# Patient Record
Sex: Male | Born: 1980 | Race: Asian | Hispanic: No | Marital: Married | Smoking: Never smoker
Health system: Southern US, Community
[De-identification: ages and names within clinical notes are randomized; demographics above are authoritative.]

## PROBLEM LIST (undated history)

## (undated) DIAGNOSIS — K219 Gastro-esophageal reflux disease without esophagitis: Secondary | ICD-10-CM

## (undated) DIAGNOSIS — IMO0002 Reserved for concepts with insufficient information to code with codable children: Secondary | ICD-10-CM

## (undated) HISTORY — PX: VASECTOMY: SHX75

## (undated) HISTORY — DX: Gastro-esophageal reflux disease without esophagitis: K21.9

## (undated) HISTORY — PX: COSMETIC SURGERY: SHX468

## (undated) HISTORY — DX: Reserved for concepts with insufficient information to code with codable children: IMO0002

## (undated) HISTORY — PX: APPENDECTOMY: SHX54

---

## 2019-12-06 ENCOUNTER — Encounter (HOSPITAL_BASED_OUTPATIENT_CLINIC_OR_DEPARTMENT_OTHER): Payer: Self-pay

## 2019-12-06 ENCOUNTER — Other Ambulatory Visit: Payer: Self-pay

## 2019-12-06 ENCOUNTER — Emergency Department (HOSPITAL_BASED_OUTPATIENT_CLINIC_OR_DEPARTMENT_OTHER)
Admission: EM | Admit: 2019-12-06 | Discharge: 2019-12-06 | Disposition: A | Discharge status: Home / Self Care | Payer: 59 | Attending: Emergency Medicine | Admitting: Emergency Medicine

## 2019-12-06 ENCOUNTER — Emergency Department (HOSPITAL_BASED_OUTPATIENT_CLINIC_OR_DEPARTMENT_OTHER): Payer: 59

## 2019-12-06 DIAGNOSIS — S20211A Contusion of right front wall of thorax, initial encounter: Secondary | ICD-10-CM | POA: Diagnosis not present

## 2019-12-06 DIAGNOSIS — S29012A Strain of muscle and tendon of back wall of thorax, initial encounter: Secondary | ICD-10-CM | POA: Insufficient documentation

## 2019-12-06 DIAGNOSIS — S3992XA Unspecified injury of lower back, initial encounter: Secondary | ICD-10-CM | POA: Diagnosis present

## 2019-12-06 DIAGNOSIS — Y92410 Unspecified street and highway as the place of occurrence of the external cause: Secondary | ICD-10-CM | POA: Insufficient documentation

## 2019-12-06 MED ORDER — KETOROLAC TROMETHAMINE 30 MG/ML IJ SOLN
30.0000 mg | Freq: Once | INTRAMUSCULAR | Status: AC
  Administered 2019-12-06: 30 mg via INTRAMUSCULAR
  Filled 2019-12-06: qty 1

## 2019-12-06 MED ORDER — IBUPROFEN 600 MG PO TABS: 600.0000 mg | ORAL_TABLET | Freq: Four times a day (QID) | ORAL | 0 refills | Status: DC | PRN
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
## 2020-11-26 ENCOUNTER — Other Ambulatory Visit: Payer: Self-pay

## 2020-11-26 ENCOUNTER — Encounter: Payer: Self-pay | Admitting: Physician Assistant

## 2020-11-26 ENCOUNTER — Ambulatory Visit (INDEPENDENT_AMBULATORY_CARE_PROVIDER_SITE_OTHER): Payer: Managed Care, Other (non HMO) | Admitting: Physician Assistant

## 2020-11-26 VITALS — BP 137/94 | HR 85 | Temp 98.2°F | Ht 64.0 in | Wt 179.0 lb

## 2020-11-26 DIAGNOSIS — Z6830 Body mass index (BMI) 30.0-30.9, adult: Secondary | ICD-10-CM

## 2020-11-26 DIAGNOSIS — R030 Elevated blood-pressure reading, without diagnosis of hypertension: Secondary | ICD-10-CM | POA: Diagnosis not present

## 2020-11-26 DIAGNOSIS — E6609 Other obesity due to excess calories: Secondary | ICD-10-CM | POA: Diagnosis not present

## 2020-11-26 DIAGNOSIS — K209 Esophagitis, unspecified without bleeding: Secondary | ICD-10-CM | POA: Diagnosis not present

## 2020-11-26 DIAGNOSIS — Z8669 Personal history of other diseases of the nervous system and sense organs: Secondary | ICD-10-CM

## 2020-11-26 NOTE — Patient Instructions (Addendum)
Recheck in 4 weeks - come fasting, try to get a morning appointment  Work on increasing exercise - 3 days cardio, 2 days weight Water intake 64 - 80 ounces daily Low salt in diet

## 2020-11-26 NOTE — Progress Notes (Signed)
   Subjective:    Patient ID: Dylan Vazquez, male    DOB: [birth year], 40 y.o.   MRN: [id number]  Chief Complaint  Patient presents with   Establish Care    HPI 40 y.o. patient presents today for new patient establishment with me.  Patient was previously established with physician in [place]. Here with his wife for visit today.   Current Care Team: None    Acute Concerns: Would like labs rechecked   Weight gain -No exercise -Vegetarian diet  -Told he was prediabetic previously. Father with hx of diabetes.   Chronic Concerns: See PMH listed below, as well as A/P for details on issues we specifically discussed during today's visit.    Upper GI Endoscopy 09-27-2019: Gr A esophagitis   Migraines - previously saw neuro specialist in [place] and says he has not had any issues since then. He stopped sodas as well, which helped.    History reviewed. No pertinent past medical history.  Past Surgical History:  Procedure Laterality Date   APPENDECTOMY     VASECTOMY      Family History  Problem Relation Age of Onset   Hypertension Mother    Diabetes Father    Arthritis Father    Asthma Son     Social History   Tobacco Use   Smoking status: Never  Substance Use Topics   Alcohol use: Never   Drug use: Never     Allergies  Allergen Reactions   Other Other (See Comments)    Review of Systems NEGATIVE UNLESS OTHERWISE INDICATED IN HPI      Objective:     BP (!) 137/94   Pulse 85   Temp 98.2 F (36.8 C) (Temporal)   Ht 5\' 4"  (1.626 m)   Wt 179 lb (81.2 kg)   SpO2 100%   BMI 30.73 kg/m   Wt Readings from Last 3 Encounters:  11/26/20 179 lb (81.2 kg)  12/06/19 176 lb 5.9 oz (80 kg)    BP Readings from Last 3 Encounters:  11/26/20 (!) 137/94  12/06/19 (!) 155/92     Physical Exam     Assessment & Plan:   Problem List Items Addressed This Visit       Digestive   Esophagitis determined by endoscopy     Other   Hx of migraines   Other Visit  Diagnoses     Elevated blood pressure reading    -  Primary   Class 1 obesity due to excess calories without serious comorbidity with body mass index (BMI) of 30.0 to 30.9 in adult           1. Elevated blood pressure reading 2. Class 1 obesity due to excess calories without serious comorbidity with body mass index (BMI) of 30.0 to 30.9 in adult -Elevated reading today -Encouraged him to work on increasing exercise - 3 days cardio, 2 days weight; Water intake 64 - 80 ounces daily; Low salt in diet  -Recheck in 4 weeks -If still trending high, consider starting on amlodipine or losartan  3. Hx of migraines -Resolved, no current issues.  4. Esophagitis determined by endoscopy -Stable, takes Prilosec OTC 20 mg     Opie Fanton M Cainen Burnham, PA-C

## 2020-11-27 DIAGNOSIS — Z8669 Personal history of other diseases of the nervous system and sense organs: Secondary | ICD-10-CM | POA: Insufficient documentation

## 2020-11-27 DIAGNOSIS — K209 Esophagitis, unspecified without bleeding: Secondary | ICD-10-CM | POA: Insufficient documentation

## 2020-12-27 ENCOUNTER — Ambulatory Visit (INDEPENDENT_AMBULATORY_CARE_PROVIDER_SITE_OTHER): Payer: Managed Care, Other (non HMO) | Admitting: Physician Assistant

## 2020-12-27 ENCOUNTER — Other Ambulatory Visit: Payer: Self-pay

## 2020-12-27 VITALS — HR 102 | Temp 99.6°F | Ht 64.0 in

## 2020-12-27 DIAGNOSIS — U071 COVID-19: Secondary | ICD-10-CM

## 2020-12-27 LAB — POCT INFLUENZA A/B
Influenza A, POC: NEGATIVE
Influenza B, POC: NEGATIVE

## 2020-12-27 LAB — POC COVID19 BINAXNOW: SARS Coronavirus 2 Ag: POSITIVE — AB

## 2020-12-27 MED ORDER — MOLNUPIRAVIR EUA 200MG CAPSULE: 4.0000 | ORAL_CAPSULE | Freq: Two times a day (BID) | ORAL | 0 refills | Status: AC

## 2020-12-27 NOTE — Progress Notes (Signed)
° °  Subjective:    Patient ID: Dylan Vazquez, male    DOB: [birth year], 40 y.o.   MRN: [id number]  Chief Complaint  Patient presents with   URI    Fever,cough,chills    HPI Patient is in today recheck from last visit, but then came in for URI symptoms. Patient was only seen and evaluated by the nurse today.   No past medical history on file.  Past Surgical History:  Procedure Laterality Date   APPENDECTOMY     VASECTOMY      Family History  Problem Relation Age of Onset   Hypertension Mother    Diabetes Father    Arthritis Father    Asthma Son     Social History   Tobacco Use   Smoking status: Never  Substance Use Topics   Alcohol use: Never   Drug use: Never     Allergies  Allergen Reactions   Other Other (See Comments)    Review of Systems NEGATIVE UNLESS OTHERWISE INDICATED IN HPI      Objective:     Pulse (!) 102    Temp 99.6 F (37.6 C)    Ht 5\' 4"  (1.626 m)    SpO2 99%    BMI 30.73 kg/m   Wt Readings from Last 3 Encounters:  11/26/20 179 lb (81.2 kg)  12/06/19 176 lb 5.9 oz (80 kg)    BP Readings from Last 3 Encounters:  11/26/20 (!) 137/94  12/06/19 (!) 155/92     Physical Exam Not examined    Assessment & Plan:   Problem List Items Addressed This Visit   None Visit Diagnoses     COVID-19    -  Primary   Relevant Medications   molnupiravir EUA (LAGEVRIO) 200 mg CAPS capsule   Other Relevant Orders   POCT Influenza A/B (Completed)   POC COVID-19 (Completed)        Meds ordered this encounter  Medications   molnupiravir EUA (LAGEVRIO) 200 mg CAPS capsule    Sig: Take 4 capsules (800 mg total) by mouth 2 (two) times daily for 5 days.    Dispense:  40 capsule    Refill:  0    1. COVID-19 Positive COVID-19 test, seen and evaluated by CMA only. CMA informed him of positive test and that medication would be sent to his pharmacy. Pt was informed to reschedule his appointment.    Naida Escalante M Tasneem Cormier, PA-C

## 2020-12-28 ENCOUNTER — Telehealth: Payer: Self-pay

## 2020-12-28 NOTE — Telephone Encounter (Signed)
Patient states was seen yesterday, 12/19.  States is requesting a script for a cough med to be sent to [facility] [address]

## 2020-12-29 ENCOUNTER — Other Ambulatory Visit: Payer: Self-pay | Admitting: Physician Assistant

## 2020-12-29 MED ORDER — PROMETHAZINE-DM 6.25-15 MG/5ML PO SYRP: 5.0000 mL | ORAL_SOLUTION | Freq: Every evening | ORAL | 0 refills | Status: DC | PRN

## 2020-12-29 MED ORDER — BENZONATATE 100 MG PO CAPS: 100.0000 mg | ORAL_CAPSULE | Freq: Three times a day (TID) | ORAL | 0 refills | Status: AC | PRN

## 2020-12-29 NOTE — Telephone Encounter (Signed)
I called and spoke directly to patient this morning. He is feeling better, but still having cough. I sent promethazine syrup for at bedtime and tessalon perles to take during the daytime. He is taking the molnupiravir without any issues. Discussed COVID-19 protocol with him and advised f/up as needed. He will reschedule for annual CPE in the new year.

## 2021-01-14 ENCOUNTER — Ambulatory Visit: Payer: Managed Care, Other (non HMO) | Admitting: Family Medicine

## 2021-01-14 ENCOUNTER — Other Ambulatory Visit: Payer: Self-pay

## 2021-01-14 ENCOUNTER — Encounter: Payer: Self-pay | Admitting: Family Medicine

## 2021-01-14 VITALS — BP 130/80 | HR 85 | Temp 98.2°F | Ht 64.0 in | Wt 175.0 lb

## 2021-01-14 DIAGNOSIS — R051 Acute cough: Secondary | ICD-10-CM

## 2021-01-14 MED ORDER — PREDNISONE 20 MG PO TABS: 40.0000 mg | ORAL_TABLET | Freq: Every day | ORAL | 0 refills | Status: AC

## 2021-01-14 NOTE — Patient Instructions (Signed)
Meds have been sent the the pharmacy °You can take tylenol for pain/fevers °If worsening symptoms, let us know or go to the Emergency room  ° ° °

## 2021-01-14 NOTE — Progress Notes (Signed)
° °  Subjective:     Patient ID: Dylan Vazquez, male    DOB: [birth year], 41 y.o.   MRN: [id number]  Chief Complaint  Patient presents with   Cough    Lingering cough from positive covid on 12/27/2020 Productive cough     HPI Started about 12/16-cough/congestion.  + covid-took molnupiravir and felt better. Went to [facility].    Tele doc rx 12/30 pred 2.5mg  for 3 day. Still w/cough productive of yellow.  Still congested as well.   In past, covid twice and got higher doses of steroids and worked.  No h/o asthma. No sob.  No f/c.  Health Maintenance Due  Topic Date Due   COVID-19 Vaccine (1) Never done   HIV Screening  Never done   Hepatitis C Screening  Never done   TETANUS/TDAP  Never done   INFLUENZA VACCINE  Never done    History reviewed. No pertinent past medical history.  Past Surgical History:  Procedure Laterality Date   APPENDECTOMY     VASECTOMY      Outpatient Medications Prior to Visit  Medication Sig Dispense Refill   Multiple Vitamin (ONE-A-DAY ESSENTIAL) TABS      omeprazole (PRILOSEC OTC) 20 MG tablet Take 20 mg by mouth daily.     promethazine-dextromethorphan (PROMETHAZINE-DM) 6.25-15 MG/5ML syrup Take 5 mLs by mouth at bedtime as needed for cough. 120 mL 0   Omega-3 Fatty Acids (FISH OIL ULTRA) 1400 MG CAPS      No facility-administered medications prior to visit.    Allergies  Allergen Reactions   Other Other (See Comments)   OMV:EHMCNOBS/[id number] except as noted in HPI      Objective:     BP 130/80    Pulse 85    Temp 98.2 F (36.8 C) (Temporal)    Ht 5\' 4"  (1.626 m)    Wt 175 lb (79.4 kg)    SpO2 98%    BMI 30.04 kg/m  Wt Readings from Last 3 Encounters:  01/14/21 175 lb (79.4 kg)  11/26/20 179 lb (81.2 kg)  12/06/19 176 lb 5.9 oz (80 kg)        Gen: WDWN NAD HEENT: NCAT, conjunctiva not injected, sclera nonicteric TM WNL B, OP moist, no exudates  NECK:  supple, no thyromegaly, no nodes, no carotid  bruits CARDIAC: RRR, S1S2+, no murmur. DP 2+B LUNGS: CTAB. No wheezes EXT:  no edema MSK: no gross abnormalities.  NEURO: A&O x3.  CN II-XII intact.  PSYCH: normal mood. Good eye contact  Assessment & Plan:   Problem List Items Addressed This Visit   None Visit Diagnoses     Acute cough    -  Primary      Cough-post covid.  Prob inflammation.  Will do pred x 5 days.  Discussed course.  Worse, no change, consider abx  Meds ordered this encounter  Medications   predniSONE (DELTASONE) 20 MG tablet    Sig: Take 2 tablets (40 mg total) by mouth daily with breakfast for 5 days.    Dispense:  10 tablet    Refill:  0    12/08/19, MD

## 2021-01-19 ENCOUNTER — Other Ambulatory Visit: Payer: Self-pay | Admitting: *Deleted

## 2021-01-19 ENCOUNTER — Telehealth: Payer: Self-pay

## 2021-01-19 MED ORDER — AMOXICILLIN 875 MG PO TABS: 875.0000 mg | ORAL_TABLET | Freq: Two times a day (BID) | ORAL | 0 refills | Status: AC

## 2021-01-19 NOTE — Telephone Encounter (Signed)
Patient states he was advised by provider to call back with an update from [place] 1/6.    Patient states his cough has gone away but he still has congestion.

## 2021-01-19 NOTE — Telephone Encounter (Signed)
Rx sent to the pharmacy. Patient notified and verbalized understanding. 
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
## 2021-10-03 ENCOUNTER — Encounter: Payer: Self-pay | Admitting: *Deleted

## 2021-12-08 ENCOUNTER — Ambulatory Visit (INDEPENDENT_AMBULATORY_CARE_PROVIDER_SITE_OTHER): Payer: Managed Care, Other (non HMO) | Admitting: Physician Assistant

## 2021-12-08 ENCOUNTER — Encounter: Payer: Self-pay | Admitting: Physician Assistant

## 2021-12-08 VITALS — BP 128/88 | HR 64 | Temp 98.0°F | Ht 64.17 in | Wt 171.0 lb

## 2021-12-08 DIAGNOSIS — Z131 Encounter for screening for diabetes mellitus: Secondary | ICD-10-CM

## 2021-12-08 DIAGNOSIS — K219 Gastro-esophageal reflux disease without esophagitis: Secondary | ICD-10-CM

## 2021-12-08 DIAGNOSIS — Z13220 Encounter for screening for lipoid disorders: Secondary | ICD-10-CM

## 2021-12-08 DIAGNOSIS — Z0000 Encounter for general adult medical examination without abnormal findings: Secondary | ICD-10-CM

## 2021-12-08 DIAGNOSIS — R519 Headache, unspecified: Secondary | ICD-10-CM | POA: Insufficient documentation

## 2021-12-08 HISTORY — DX: Encounter for general adult medical examination without abnormal findings: Z00.00

## 2021-12-08 LAB — COMPREHENSIVE METABOLIC PANEL
ALT: 22 U/L (ref 0–53)
AST: 16 U/L (ref 0–37)
Albumin: 4.6 g/dL (ref 3.5–5.2)
Alkaline Phosphatase: 100 U/L (ref 39–117)
BUN: 6 mg/dL (ref 6–23)
CO2: 29 mEq/L (ref 19–32)
Calcium: 9.8 mg/dL (ref 8.4–10.5)
Chloride: 103 mEq/L (ref 96–112)
Creatinine, Ser: 0.79 mg/dL (ref 0.40–1.50)
GFR: 111.88 mL/min (ref >60.00)
Glucose, Bld: 93 mg/dL (ref 70–99)
Potassium: 5.1 mEq/L (ref 3.5–5.1)
Sodium: 141 mEq/L (ref 135–145)
Total Bilirubin: 0.4 mg/dL (ref 0.2–1.2)
Total Protein: 7.6 g/dL (ref 6.0–8.3)

## 2021-12-08 LAB — CBC WITH DIFFERENTIAL/PLATELET
Basophils Absolute: 0 10*3/uL (ref 0.0–0.1)
Basophils Relative: 0.5 % (ref 0.0–3.0)
Eosinophils Absolute: 0 10*3/uL (ref 0.0–0.7)
Eosinophils Relative: 0.5 % (ref 0.0–5.0)
HCT: 47.5 % (ref 39.0–52.0)
Hemoglobin: 15.9 g/dL (ref 13.0–17.0)
Lymphocytes Relative: 23.6 % (ref 12.0–46.0)
Lymphs Abs: 1.7 10*3/uL (ref 0.7–4.0)
MCHC: 33.5 g/dL (ref 30.0–36.0)
MCV: 85.6 fl (ref 78.0–100.0)
Monocytes Absolute: 0.4 10*3/uL (ref 0.1–1.0)
Monocytes Relative: 6.3 % (ref 3.0–12.0)
Neutro Abs: 4.9 10*3/uL (ref 1.4–7.7)
Neutrophils Relative %: 69.1 % (ref 43.0–77.0)
Platelets: 359 10*3/uL (ref 150.0–400.0)
RBC: 5.55 Mil/uL (ref 4.22–5.81)
RDW: 13.7 % (ref 11.5–15.5)
WBC: 7.1 10*3/uL (ref 4.0–10.5)

## 2021-12-08 LAB — LIPID PANEL
Cholesterol: 209 mg/dL — ABNORMAL HIGH (ref 0–200)
HDL: 46.9 mg/dL (ref >39.00)
LDL Cholesterol: 125 mg/dL — ABNORMAL HIGH (ref 0–99)
NonHDL: 161.62
Total CHOL/HDL Ratio: 4
Triglycerides: 184 mg/dL — ABNORMAL HIGH (ref 0.0–149.0)
VLDL: 36.8 mg/dL (ref 0.0–40.0)

## 2021-12-08 LAB — HEMOGLOBIN A1C: Hgb A1c MFr Bld: 5.7 % (ref 4.6–6.5)

## 2021-12-08 LAB — TSH: TSH: 0.98 u[IU]/mL (ref 0.35–5.50)

## 2021-12-08 NOTE — Assessment & Plan Note (Signed)
Avoiding triggers is helping; lifestyle controlled at this time

## 2021-12-08 NOTE — Assessment & Plan Note (Signed)
Controlled with esomeprazole

## 2021-12-08 NOTE — Assessment & Plan Note (Signed)
>>  ASSESSMENT AND PLAN FOR INTERMITTENT HEADACHE WRITTEN ON 12/08/2021 11:07 AM BY ALLWARDT, ALYSSA M, PA-C  Avoiding triggers is helping; lifestyle controlled at this time

## 2021-12-08 NOTE — Patient Instructions (Addendum)
Great to see you! Keep up the excellent work!  Labs today, treat pending abnormal results.   Tdap and flu shot updated today.   Let me know if headaches change in frequency or intensity at all. Avoid triggers.

## 2021-12-08 NOTE — Assessment & Plan Note (Signed)
Age-appropriate screening and counseling performed today. Will check labs and call with results. Preventive measures discussed and printed in AVS for patient.   Patient Counseling: [x]  Nutrition: Stressed importance of moderation in sodium/caffeine intake, saturated fat and cholesterol, caloric balance, sufficient intake of fresh fruits, vegetables, and fiber.  [x]  Stressed the importance of regular exercise.   [x]  Substance Abuse: Discussed cessation/primary prevention of tobacco, alcohol, or other drug use; driving or other dangerous activities under the influence; availability of treatment for abuse.   []  Injury prevention: Discussed safety belts, safety helmets, smoke detector, smoking near bedding or upholstery.   []  Sexuality: Discussed sexually transmitted diseases, partner selection, use of condoms, avoidance of unintended pregnancy  and contraceptive alternatives.   [x]  Dental health: Discussed importance of regular tooth brushing, flossing, and dental visits.  [x]  Health maintenance and immunizations reviewed. Please refer to Health maintenance section.     

## 2021-12-08 NOTE — Progress Notes (Signed)
Subjective:    Patient ID: Dylan Vazquez, male    DOB: [birth year], 41 y.o.   MRN: [id number]  Chief Complaint  Patient presents with   Annual Exam    Pt in office for annual CPE with fasting labs; pt getting headaches during stress; and also c/o acid reflux    HPI Patient is in today for annual exam. Here with his wife.  Acute concerns: Occasional right sided headaches - triggered from bathroom cleaners, sodas  Health maintenance: Lifestyle/ exercise: Walking more Nutrition: Working on nutrition changes including lighter meals at night, not snacking, less salt  Mental health: Doing well  Caffeine: Tea once daily  Sleep: Doing well, about 7 hours per night  Substance use: None  Sexual activity: Monogamous  Immunizations: Will update Tdap and flu today  Colonoscopy: start at age 40    Past Medical History:  Diagnosis Date   Encounter for annual physical exam 12/08/2021   GERD (gastroesophageal reflux disease) 01/10/2007   Ulcer 01/10/2007   Acidity issue on esophageal    Past Surgical History:  Procedure Laterality Date   APPENDECTOMY     COSMETIC SURGERY     Gynacomastia   VASECTOMY      Family History  Problem Relation Age of Onset   Hypertension Mother    Diabetes Father    Arthritis Father    Asthma Son     Social History   Tobacco Use   Smoking status: Never  Substance Use Topics   Alcohol use: Never   Drug use: Never     Allergies  Allergen Reactions   Other Other (See Comments)    Review of Systems NEGATIVE UNLESS OTHERWISE INDICATED IN HPI      Objective:     BP 128/88 (BP Location: Left Arm)   Pulse 64   Temp 98 F (36.7 C) (Temporal)   Ht 5' 4.17" (1.63 m)   Wt 171 lb (77.6 kg)   SpO2 98%   BMI 29.19 kg/m   Wt Readings from Last 3 Encounters:  12/08/21 171 lb (77.6 kg)  01/14/21 175 lb (79.4 kg)  11/26/20 179 lb (81.2 kg)    BP Readings from Last 3 Encounters:  12/08/21 128/88  01/14/21 130/80  11/26/20 (!) 137/94      Physical Exam Vitals and nursing note reviewed.  Constitutional:      General: He is not in acute distress.    Appearance: Normal appearance. He is not toxic-appearing.  HENT:     Head: Normocephalic and atraumatic.     Right Ear: Tympanic membrane, ear canal and external ear normal.     Left Ear: Tympanic membrane, ear canal and external ear normal.     Nose: Nose normal.     Mouth/Throat:     Mouth: Mucous membranes are moist.     Pharynx: Oropharynx is clear.  Eyes:     Extraocular Movements: Extraocular movements intact.     Conjunctiva/sclera: Conjunctivae normal.     Pupils: Pupils are equal, round, and reactive to light.  Cardiovascular:     Rate and Rhythm: Normal rate and regular rhythm.     Pulses: Normal pulses.     Heart sounds: Normal heart sounds.  Pulmonary:     Effort: Pulmonary effort is normal.     Breath sounds: Normal breath sounds.  Abdominal:     General: Abdomen is flat. Bowel sounds are normal.     Palpations: Abdomen is soft.     Tenderness: There  is no abdominal tenderness.  Musculoskeletal:        General: Normal range of motion.     Cervical back: Normal range of motion and neck supple.  Skin:    General: Skin is warm and dry.  Neurological:     General: No focal deficit present.     Mental Status: He is alert and oriented to person, place, and time.     Cranial Nerves: No cranial nerve deficit.     Motor: No weakness.     Gait: Gait normal.  Psychiatric:        Mood and Affect: Mood normal.        Behavior: Behavior normal.        Assessment & Plan:  Encounter for annual physical exam Assessment & Plan: Age-appropriate screening and counseling performed today. Will check labs and call with results. Preventive measures discussed and printed in AVS for patient.   Patient Counseling: [x]   Nutrition: Stressed importance of moderation in sodium/caffeine intake, saturated fat and cholesterol, caloric balance, sufficient intake of fresh  fruits, vegetables, and fiber.  [x]   Stressed the importance of regular exercise.   [x]   Substance Abuse: Discussed cessation/primary prevention of tobacco, alcohol, or other drug use; driving or other dangerous activities under the influence; availability of treatment for abuse.   []   Injury prevention: Discussed safety belts, safety helmets, smoke detector, smoking near bedding or upholstery.   []   Sexuality: Discussed sexually transmitted diseases, partner selection, use of condoms, avoidance of unintended pregnancy  and contraceptive alternatives.   [x]   Dental health: Discussed importance of regular tooth brushing, flossing, and dental visits.  [x]   Health maintenance and immunizations reviewed. Please refer to Health maintenance section.      Orders: -     CBC with Differential/Platelet -     Comprehensive metabolic panel -     Lipid panel -     TSH -     Hemoglobin A1c  Gastroesophageal reflux disease without esophagitis Assessment & Plan: Controlled with esomeprazole    Diabetes mellitus screening -     Comprehensive metabolic panel -     Hemoglobin A1c  Screening for cholesterol level -     Lipid panel  Intermittent headache Assessment & Plan: Avoiding triggers is helping; lifestyle controlled at this time         Return in about 1 year (around 12/09/2022) for Annual exam .  This note was prepared with assistance of Dragon voice recognition software. Occasional wrong-word or sound-a-like substitutions may have occurred due to the inherent limitations of voice recognition software.    Shay Bartoli M Winthrop Shannahan, PA-C

## 2021-12-22 ENCOUNTER — Encounter: Payer: Self-pay | Admitting: *Deleted

## 2022-03-14 ENCOUNTER — Encounter: Payer: Self-pay | Admitting: Internal Medicine

## 2022-03-14 ENCOUNTER — Ambulatory Visit: Payer: Managed Care, Other (non HMO) | Admitting: Internal Medicine

## 2022-03-14 ENCOUNTER — Ambulatory Visit: Payer: Managed Care, Other (non HMO) | Admitting: Physician Assistant

## 2022-03-14 VITALS — BP 120/86 | HR 76 | Temp 98.2°F | Ht 64.17 in | Wt 170.8 lb

## 2022-03-14 DIAGNOSIS — J329 Chronic sinusitis, unspecified: Secondary | ICD-10-CM | POA: Diagnosis not present

## 2022-03-14 DIAGNOSIS — G43809 Other migraine, not intractable, without status migrainosus: Secondary | ICD-10-CM | POA: Diagnosis not present

## 2022-03-14 MED ORDER — BENZONATATE 100 MG PO CAPS: ORAL_CAPSULE | ORAL | 2 refills | Status: DC

## 2022-03-14 MED ORDER — SIMPLY SALINE 0.9 % NA AERS: 2.0000 | INHALATION_SPRAY | NASAL | 11 refills | Status: DC

## 2022-03-14 MED ORDER — FLUTICASONE PROPIONATE 50 MCG/ACT NA SUSP: 2.0000 | Freq: Every day | NASAL | 6 refills | Status: AC

## 2022-03-14 MED ORDER — SUMATRIPTAN SUCCINATE 50 MG PO TABS: 50.0000 mg | ORAL_TABLET | Freq: Every day | ORAL | 0 refills | Status: AC | PRN

## 2022-03-14 MED ORDER — LORATADINE 10 MG PO TABS: 10.0000 mg | ORAL_TABLET | Freq: Every day | ORAL | 11 refills | Status: DC

## 2022-03-14 MED ORDER — AMOXICILLIN-POT CLAVULANATE 875-125 MG PO TABS: 1.0000 | ORAL_TABLET | Freq: Two times a day (BID) | ORAL | 0 refills | Status: DC

## 2022-03-14 NOTE — Patient Instructions (Signed)
Keeping Your Sinuses Healthy: A Guide to Saline Mists and Nasal Steroids  Introduction  Sinus congestion is a common problem that can make breathing difficult and lead to headaches, facial pressure, and even fatigue. While allergies and infections are common culprits, there are steps you can take to prevent congestion and keep your sinuses healthy. This handout will explain the benefits of using saline nasal misting rinses nightly and nasal steroid sprays for allergies, both as preventative and treatment measures.  Saline Nasal Mists: Your Daily Defense  Imagine a gentle shower cleaning out dust and allergens from your nasal passages.  Saline nasal mists are essentially a saltwater solution delivered in a fine mist. This mist helps in several ways:  Thin mucus: Saline helps loosen thick mucus, making it easier to clear and preventing it from becoming a breeding ground for bacteria. Moisten dry passages: Dry air can irritate nasal passages and make congestion worse. Saline mist adds moisture, soothing irritation and promoting healthy mucus production. Remove allergens and irritants: Dust, pollen, and other allergens can trigger congestion and allergies. Saline mist helps wash away these irritants, reducing inflammation and discomfort. Nightly Rinsing for Optimal Prevention  Regular use of saline nasal mists, particularly at night, can significantly benefit your sinus health. Here's why:  Nighttime drainage: While you sleep, mucus tends to drain more freely. Saline mist used before bed helps loosen mucus, allowing for better drainage and preventing it from accumulating and causing congestion. Preventative approach: Using saline mist nightly can help prevent congestion before it starts, reducing the likelihood of developing sinus infections and allergy flare-ups.  Nasal Steroid Sprays: Targeting Allergic Inflammation  If allergies are a major contributor to your sinus issues, nasal steroid  sprays can be a powerful tool. Here's how they work:  Reduce inflammation: Allergies cause inflammation in the nasal passages, leading to congestion and irritation. Steroid sprays work by reducing this inflammation, allowing you to breathe easier. Targeted relief: Unlike oral allergy medications that can cause drowsiness, nasal steroid sprays deliver medication directly to the affected area, minimizing side effects.  Combining Strategies for Maximum Relief  For optimal results, consider incorporating both saline mist and nasal steroid sprays into your routine:  Start with a saline mist: Use a saline nasal mist to loosen mucus and clear your nasal passages. Follow with a steroid spray (if needed): If you experience allergy symptoms, use a nasal steroid spray after the saline mist. This allows the steroid medication to reach deeper into the now-cleared nasal passages for better absorption.  Remember: Be consistent! Daily or nightly use, as recommended by your doctor, is key to reaping the full benefits of these treatments. Don't overdo it with too many steroid sprays. Just once daily max after nasal saline misting.  By incorporating saline nasal mists and nasal steroid sprays into your routine, you can take a proactive approach to sinus health, preventing congestion and keeping allergies at [place].  Ultimately, this results in less sinus, throat, and ear infections.  You can continue this even when sick as a treatment as well, reducing your dependence on other decongestants.

## 2022-03-14 NOTE — Progress Notes (Signed)
[facility]: [id number]   Routine Medical Office Visit  Patient:  Dylan Vazquez      Age: 42 y.o.       Sex:  male  Date:   03/14/2022  PCP:    Fredirick Lathe, PA-C   Today's Healthcare Provider: Loralee Pacas, MD   Assessment and Plan:   Dylan Vazquez was seen today for ongoing cough and right sided headache.  Chronic sinusitis, unspecified location -     Benzonatate; Take twice daily as needed by mouth  Dispense: 30 capsule; Refill: 2 -     Fluticasone Propionate; Place 2 sprays into both nostrils daily.  Dispense: 16 g; Refill: 6 -     Simply Saline; Place 2 each into the nose as directed. Use nightly for sinus hygiene long-term.  Can also be used as many times daily as desired to assist with clearing congested sinuses.  Dispense: 127 mL; Refill: 11 -     Loratadine; Take 1 tablet (10 mg total) by mouth daily.  Dispense: 30 tablet; Refill: 11 -     Amoxicillin-Pot Clavulanate; Take 1 tablet by mouth 2 (two) times daily.  Dispense: 20 tablet; Refill: 0  Other migraine without status migrainosus, not intractable -     SUMAtriptan Succinate; Take 1 tablet (50 mg total) by mouth daily as needed for migraine. May repeat in 2 hours if headache persists or recurs.  Dispense: 10 tablet; Refill: 0       Clinical Presentation:   The patient is a 42 y.o. male: Active Ambulatory Problems    Diagnosis Date Noted   Hx of migraines 11/27/2020   Esophagitis determined by endoscopy 11/27/2020   Encounter for annual physical exam 12/08/2021   Gastroesophageal reflux disease without esophagitis 12/08/2021   Intermittent headache 12/08/2021   Resolved Ambulatory Problems    Diagnosis Date Noted   No Resolved Ambulatory Problems   Past Medical History:  Diagnosis Date   GERD (gastroesophageal reflux disease) 01/10/2007   Ulcer 01/10/2007    Outpatient Medications Prior to Visit  Medication Sig   UNABLE TO FIND Esomeprazole '40mg'$  + Levosulpiride '75mg'$  (from  [place]).   [DISCONTINUED] benzonatate (TESSALON) 100 MG capsule    No facility-administered medications prior to visit.     Chief Complaint  Patient presents with   Ongoing cough    Intermittent for almost ten days, producing slight yellowish mucus. No other symptoms. Takes tessalon with relief, cough returns if not taken.   Right sided headache    Intermittent for a few years.    HPI  Doesn't feel like he has sinus congestion, patient reports productive cough. In room, lungs are clear to auscultation bilaterally, turbinates are swollen, boggy, erythematous, and cough is nonproductive. 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   Wife has systemic signs and symptoms such as fever, myalgias, malaise, fatigue, chills with new infection.       Clinical Data Analysis:  Physical Exam  BP 120/86 (BP Location: Left Arm, Patient Position: Sitting)   Pulse 76   Temp 98.2 F (36.8 C) (Temporal)   Ht 5' 4.17" (1.63 m)   Wt 170 lb 12.8 oz (77.5 kg)   SpO2 99%   BMI 29.16 kg/m  Wt Readings from Last 10 Encounters:  03/14/22 170 lb 12.8 oz (77.5 kg)  12/08/21 171 lb (77.6 kg)  01/14/21 175 lb (79.4 kg)  11/26/20 179 lb (  81.2 kg)  12/06/19 176 lb 5.9 oz (80 kg)   Vital signs reviewed.  Nursing notes reviewed. Weight trend reviewed. Abnormalities noted: none  Overweight  by BMI criteria is noted but in my medical opinion, BMI is an unreliable indicator of healthy body composition due to its inability to reflect lean muscle mass.  General Appearance:  Well developed, well nourished male in no acute distress.   Pulmonary:  Normal work of breathing at rest, no respiratory distress apparent. SpO2: 99 %  Musculoskeletal: All extremities are intact.  Neurological:  Awake, alert. No obvious focal neurological deficits or cognitive impairments.  Sensorium seems unclouded. Psychiatric:   Appropriate mood, pleasant demeanor Problem-specific findings:  lungs are clear to auscultation bilaterally, turbinates are swollen, boggy, erythematous, and cough is nonproductive.  results He Declined offered COVID testing on basis of long term symptom(s) with no systemic signs and symptoms such as fever, myalgias, malaise, fatigue, chills       Signed: Loralee Pacas, MD 03/14/2022 6:22 PM

## 2022-03-27 ENCOUNTER — Encounter: Payer: Self-pay | Admitting: Family

## 2022-03-27 ENCOUNTER — Ambulatory Visit: Payer: Managed Care, Other (non HMO) | Admitting: Family

## 2022-03-27 VITALS — BP 131/88 | HR 73 | Temp 97.7°F | Ht 64.0 in | Wt 172.0 lb

## 2022-03-27 DIAGNOSIS — R053 Chronic cough: Secondary | ICD-10-CM | POA: Diagnosis not present

## 2022-03-27 MED ORDER — GUAIFENESIN ER 600 MG PO TB12: 1200.0000 mg | ORAL_TABLET | Freq: Two times a day (BID) | ORAL | 0 refills | Status: DC

## 2022-03-27 MED ORDER — PREDNISONE 20 MG PO TABS: ORAL_TABLET | ORAL | 0 refills | Status: DC

## 2022-03-27 NOTE — Progress Notes (Signed)
Patient ID: Dylan Vazquez, male    DOB: [birth year], 42 y.o.   MRN: [id number]  Chief Complaint  Patient presents with   Cough    Pt c/o cough with yellow mucus for about a month. Just finished Amox and taking Tessalon pearles are helping sx but not much.     HPI:      Persistent cough:  finished Augmentin on 3/15 after seeing another provider in office, states he was having cough also then & headache,  feels like he has phlegm in his chest, that is not completely coming up, it is light yellowish in color. Denies any fatigue, fever, sinus sx, SOB, or body aches.  States he coughs after eating or drinking in the morning, but not in evenings or when lying down, and denies any heartburn lately, he has PPI levosulpiride/esomeprazole med he takes from [place], normally just takes prn. 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   Assessment & Plan:  1. Persistent cough for 3 weeks or longer - lungs clear, pt clearing his throat many times during visit. sending pred pack, generic Mucinex, advised on use & SE of both, advised to continue the Claritin qd & Flonase bid, using saline nasal spray tid & prior to Flonase. Advised his allergy sx may be worse this year & should wear a mask when walking outside & keep windows closed until pollen is gone. Also advised pt take his antacid pill daily for next 2 weeks to see if this helps sx also.  - predniSONE (DELTASONE) 20 MG tablet; Take 2 pills in the morning with breakfast for 3 days, then 1 pill for 2 days  Dispense: 8 tablet; Refill: 0 - guaiFENesin (MUCINEX) 600 MG 12 hr tablet; Take 2 tablets (1,200 mg total) by mouth 2 (two) times daily.  Dispense: 20 tablet; Refill: 0  Subjective:    Outpatient Medications Prior to Visit  Medication Sig Dispense Refill   benzonatate (TESSALON) 100 MG capsule Take twice daily as needed by mouth 30 capsule 2   fluticasone (FLONASE) 50 MCG/ACT nasal spray Place 2 sprays into both nostrils daily. 16 g  6   loratadine (CLARITIN) 10 MG tablet Take 1 tablet (10 mg total) by mouth daily. 30 tablet 11   Saline (SIMPLY SALINE) 0.9 % AERS Place 2 each into the nose as directed. Use nightly for sinus hygiene long-term.  Can also be used as many times daily as desired to assist with clearing congested sinuses. 127 mL 11   SUMAtriptan (IMITREX) 50 MG tablet Take 1 tablet (50 mg total) by mouth daily as needed for migraine. May repeat in 2 hours if headache persists or recurs. 10 tablet 0   UNABLE TO FIND Esomeprazole 40mg  + Levosulpiride 75mg  (from [place]).     amoxicillin-clavulanate (AUGMENTIN) 875-125 MG tablet Take 1 tablet by mouth 2 (two) times daily. (Patient not taking: Reported on 03/27/2022) 20 tablet 0   No facility-administered medications prior to visit.   Past Medical History:  Diagnosis Date   Encounter for annual physical exam 12/08/2021   GERD (gastroesophageal reflux disease) 01/10/2007   Ulcer 01/10/2007   Acidity issue on esophageal   Past Surgical History:  Procedure Laterality Date   APPENDECTOMY     COSMETIC SURGERY     Gynacomastia   VASECTOMY     Allergies  Allergen Reactions   Other Other (See Comments)      Objective:    Physical Exam Vitals and nursing  note reviewed.  Constitutional:      General: He is not in acute distress.    Appearance: Normal appearance. He is obese. He is not ill-appearing.  HENT:     Head: Normocephalic.     Right Ear: Tympanic membrane and ear canal normal.     Left Ear: Tympanic membrane and ear canal normal.     Nose:     Right Sinus: No maxillary sinus tenderness or frontal sinus tenderness.     Left Sinus: No maxillary sinus tenderness or frontal sinus tenderness.     Mouth/Throat:     Mouth: Mucous membranes are moist.     Pharynx: Oropharyngeal exudate (mild) and posterior oropharyngeal erythema (mild) present. No pharyngeal swelling or uvula swelling.     Tonsils: No tonsillar exudate or tonsillar abscesses.   Cardiovascular:     Rate and Rhythm: Normal rate and regular rhythm.  Pulmonary:     Effort: Pulmonary effort is normal.     Breath sounds: Normal breath sounds.  Musculoskeletal:        General: Normal range of motion.     Cervical back: Normal range of motion.  Lymphadenopathy:     Head:     Right side of head: No preauricular or posterior auricular adenopathy.     Left side of head: No preauricular or posterior auricular adenopathy.     Cervical: No cervical adenopathy.  Skin:    General: Skin is warm and dry.  Neurological:     Mental Status: He is alert and oriented to person, place, and time.  Psychiatric:        Mood and Affect: Mood normal.    BP 131/88 (BP Location: Left Arm, Patient Position: Sitting, Cuff Size: Large)   Pulse 73   Temp 97.7 F (36.5 C) (Temporal)   Ht 5\' 4"  (1.626 m)   Wt 172 lb (78 kg)   SpO2 100%   BMI 29.52 kg/m  Wt Readings from Last 3 Encounters:  03/27/22 172 lb (78 kg)  03/14/22 170 lb 12.8 oz (77.5 kg)  12/08/21 171 lb (77.6 kg)       Jeanie Sewer, NP

## 2022-11-20 ENCOUNTER — Telehealth: Payer: Self-pay | Admitting: Physician Assistant

## 2022-11-20 NOTE — Telephone Encounter (Signed)
Patient requesting TOC from Alyssa to Dr.Morrison . States he would like to see MD . Please advise [phone]

## 2022-12-26 ENCOUNTER — Encounter: Payer: Self-pay | Admitting: Internal Medicine

## 2022-12-26 ENCOUNTER — Ambulatory Visit: Payer: 59 | Admitting: Internal Medicine

## 2022-12-26 VITALS — BP 129/87 | HR 98 | Temp 98.3°F | Ht 64.0 in | Wt 173.6 lb

## 2022-12-26 DIAGNOSIS — J309 Allergic rhinitis, unspecified: Secondary | ICD-10-CM | POA: Diagnosis not present

## 2022-12-26 DIAGNOSIS — B351 Tinea unguium: Secondary | ICD-10-CM

## 2022-12-26 DIAGNOSIS — R638 Other symptoms and signs concerning food and fluid intake: Secondary | ICD-10-CM | POA: Diagnosis not present

## 2022-12-26 DIAGNOSIS — E8881 Metabolic syndrome: Secondary | ICD-10-CM

## 2022-12-26 DIAGNOSIS — L918 Other hypertrophic disorders of the skin: Secondary | ICD-10-CM

## 2022-12-26 DIAGNOSIS — M25562 Pain in left knee: Secondary | ICD-10-CM

## 2022-12-26 DIAGNOSIS — E538 Deficiency of other specified B group vitamins: Secondary | ICD-10-CM

## 2022-12-26 DIAGNOSIS — Z119 Encounter for screening for infectious and parasitic diseases, unspecified: Secondary | ICD-10-CM

## 2022-12-26 DIAGNOSIS — Z0000 Encounter for general adult medical examination without abnormal findings: Secondary | ICD-10-CM

## 2022-12-26 DIAGNOSIS — G8929 Other chronic pain: Secondary | ICD-10-CM

## 2022-12-26 DIAGNOSIS — R000 Tachycardia, unspecified: Secondary | ICD-10-CM | POA: Diagnosis not present

## 2022-12-26 DIAGNOSIS — Z23 Encounter for immunization: Secondary | ICD-10-CM | POA: Diagnosis not present

## 2022-12-26 DIAGNOSIS — Z0001 Encounter for general adult medical examination with abnormal findings: Secondary | ICD-10-CM | POA: Diagnosis not present

## 2022-12-26 DIAGNOSIS — E161 Other hypoglycemia: Secondary | ICD-10-CM

## 2022-12-26 DIAGNOSIS — Z789 Other specified health status: Secondary | ICD-10-CM | POA: Diagnosis not present

## 2022-12-26 DIAGNOSIS — M25561 Pain in right knee: Secondary | ICD-10-CM | POA: Diagnosis not present

## 2022-12-26 DIAGNOSIS — K219 Gastro-esophageal reflux disease without esophagitis: Secondary | ICD-10-CM

## 2022-12-26 DIAGNOSIS — E559 Vitamin D deficiency, unspecified: Secondary | ICD-10-CM

## 2022-12-26 DIAGNOSIS — E785 Hyperlipidemia, unspecified: Secondary | ICD-10-CM | POA: Diagnosis not present

## 2022-12-26 DIAGNOSIS — Z113 Encounter for screening for infections with a predominantly sexual mode of transmission: Secondary | ICD-10-CM

## 2022-12-26 HISTORY — DX: Hyperlipidemia, unspecified: E78.5

## 2022-12-26 LAB — LIPID PANEL
Cholesterol: 222 mg/dL — ABNORMAL HIGH (ref 0–200)
HDL: 50.6 mg/dL (ref >39.00)
LDL Cholesterol: 135 mg/dL — ABNORMAL HIGH (ref 0–99)
NonHDL: 170.99
Total CHOL/HDL Ratio: 4
Triglycerides: 178 mg/dL — ABNORMAL HIGH (ref 0.0–149.0)
VLDL: 35.6 mg/dL (ref 0.0–40.0)

## 2022-12-26 LAB — COMPREHENSIVE METABOLIC PANEL
ALT: 31 U/L (ref 0–53)
AST: 21 U/L (ref 0–37)
Albumin: 4.4 g/dL (ref 3.5–5.2)
Alkaline Phosphatase: 88 U/L (ref 39–117)
BUN: 8 mg/dL (ref 6–23)
CO2: 30 meq/L (ref 19–32)
Calcium: 9.2 mg/dL (ref 8.4–10.5)
Chloride: 101 meq/L (ref 96–112)
Creatinine, Ser: 0.77 mg/dL (ref 0.40–1.50)
GFR: 111.92 mL/min (ref >60.00)
Glucose, Bld: 86 mg/dL (ref 70–99)
Potassium: 4.3 meq/L (ref 3.5–5.1)
Sodium: 137 meq/L (ref 135–145)
Total Bilirubin: 0.4 mg/dL (ref 0.2–1.2)
Total Protein: 7.6 g/dL (ref 6.0–8.3)

## 2022-12-26 LAB — CBC WITH DIFFERENTIAL/PLATELET
Basophils Absolute: 0 10*3/uL (ref 0.0–0.1)
Basophils Relative: 0.4 % (ref 0.0–3.0)
Eosinophils Absolute: 0.1 10*3/uL (ref 0.0–0.7)
Eosinophils Relative: 1.2 % (ref 0.0–5.0)
HCT: 48.5 % (ref 39.0–52.0)
Hemoglobin: 16.3 g/dL (ref 13.0–17.0)
Lymphocytes Relative: 25.6 % (ref 12.0–46.0)
Lymphs Abs: 1.5 10*3/uL (ref 0.7–4.0)
MCHC: 33.7 g/dL (ref 30.0–36.0)
MCV: 86.2 fL (ref 78.0–100.0)
Monocytes Absolute: 0.4 10*3/uL (ref 0.1–1.0)
Monocytes Relative: 6.8 % (ref 3.0–12.0)
Neutro Abs: 4 10*3/uL (ref 1.4–7.7)
Neutrophils Relative %: 66 % (ref 43.0–77.0)
Platelets: 367 10*3/uL (ref 150.0–400.0)
RBC: 5.63 Mil/uL (ref 4.22–5.81)
RDW: 13.5 % (ref 11.5–15.5)
WBC: 6 10*3/uL (ref 4.0–10.5)

## 2022-12-26 LAB — VITAMIN D 25 HYDROXY (VIT D DEFICIENCY, FRACTURES): VITD: 12.85 ng/mL — ABNORMAL LOW (ref 30.00–100.00)

## 2022-12-26 LAB — VITAMIN B12: Vitamin B-12: 206 pg/mL — ABNORMAL LOW (ref 211–911)

## 2022-12-26 LAB — HEMOGLOBIN A1C: Hgb A1c MFr Bld: 5.8 % (ref 4.6–6.5)

## 2022-12-26 MED ORDER — BLOOD GLUCOSE TEST VI STRP: 1.0000 | ORAL_STRIP | Freq: Three times a day (TID) | 0 refills | Status: AC

## 2022-12-26 MED ORDER — BLOOD GLUCOSE MONITORING SUPPL DEVI: 1.0000 | Freq: Three times a day (TID) | 0 refills | Status: AC

## 2022-12-26 MED ORDER — LANCETS MISC. MISC: 1.0000 | Freq: Three times a day (TID) | 0 refills | Status: DC

## 2022-12-26 MED ORDER — LANCET DEVICE MISC: 1.0000 | Freq: Three times a day (TID) | 0 refills | Status: AC

## 2022-12-26 MED ORDER — CICLOPIROX OLAMINE 0.77 % EX CREA: TOPICAL_CREAM | Freq: Two times a day (BID) | CUTANEOUS | 3 refills | Status: AC

## 2022-12-26 NOTE — Progress Notes (Signed)
[facility]: [phone]   -- Annual Preventive Medical Office Visit --  Patient:  Dylan Vazquez      Age: 42 y.o.       Sex:  male  Date:   12/26/2022 Patient Care Team: Lula Olszewski, MD as PCP - General (Internal Medicine) Today's Healthcare Provider: Lula Olszewski, MD  ========================================= Chief Complaint  Patient presents with   Transitions Of Care   Annual Exam    Purpose of Visit: Comprehensive preventive health assessment and personalized health maintenance planning.  This encounter was conducted as a Comprehensive Physical Exam (CPE) preventive care annual visit. The patient's medical history and problem list were reviewed to inform individualized preventive care recommendations.   No problem-specific medical treatment was provided during this visit.    Assessment & Plan Encounter for annual health examination  Tachycardia Tachycardia only with exertion. More than expected. Likely deconditioning They report an elevated heart rate of 160-170 bpm during moderate exercise, compared to peers with heart rates around 120 bpm, but have no symptoms of chest pain or shortness of breath. We discussed potential heart disease causes and decided on a coronary CT scan (calcium score) as a cost-effective assessment method, costing $100 out-of-pocket. We also considered an EKG and a Zio heart monitor, the latter providing 14 days of data, though it may not be feasible before year-end. We will order an EKG and a coronary CT scan. Chronic pain of both knees Chronic Knee Pain They have bilateral knee pain exacerbated by increased activity and stair climbing, likely related to weight and physical activity. We discussed that weight loss could reduce knee strain and potentially delay the need for future surgical intervention and recommend weight loss to reduce knee strain. Weight disorder Weight Disorder Their overweight status  contributes to knee pain and potentially other metabolic issues. We discussed the importance of weight loss for overall health improvement and recommend weight loss. Flu vaccine need  Gastroesophageal reflux disease without esophagitis Esophagitis They have ongoing heartburn despite long-term medication use and a previous endoscopy done in [place]. We discussed the need for a repeat endoscopy to assess the current condition and adjust treatment as necessary. We will refer them to gastroenterology for a repeat endoscopy. Metabolic syndrome Metabolic Syndrome They have borderline HbA1c, high cholesterol, and central obesity. We emphasized the importance of monitoring and managing these risk factors and explained that HbA1c and a lipid panel are essential for ongoing assessment. We will order HbA1c and a lipid panel. Hyperlipidemia, unspecified hyperlipidemia type  Vitamin D deficiency Vitamin D Deficiency Their vegetarian diet likely causes vitamin D deficiency. We discussed the importance of monitoring vitamin D levels, especially given dietary restrictions. We will order a vitamin D level. Vitamin B12 deficiency Vitamin B12 Deficiency Their vegetarian diet likely causes vitamin B12 deficiency. We discussed the importance of monitoring vitamin B12 levels, especially given dietary restrictions. We will order a vitamin B12 level. 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 We discussed using a blood glucose monitor to confirm the diagnosis, noting that insurance might not cover the cost. We will order a blood glucose monitor. Screen for STD (sexually transmitted disease)  Screening examination for infectious disease  Onychomycosis Onychomycosis They have a possible fungal infection of the toenails. We discussed antifungal cream as a  treatment option to improve nail appearance and health. We will prescribe  antifungal cream. Encounter for annual general medical examination with abnormal findings in adult  Preventative health care  Need for influenza vaccination  Need for Tdap vaccination  Allergic rhinitis, unspecified seasonality, unspecified trigger Allergic Rhinitis They have nasal congestion and swelling, particularly on the left side, likely due to allergies. We discussed daily sinus rinse and Flonase as effective treatments to reduce symptoms. We recommend a daily sinus rinse and will prescribe Flonase. Skin tags, multiple acquired Skin Tags They have multiple skin tags and are interested in removal. We discussed cryotherapy as a removal method, noting the potential for minor scarring. We will schedule cryotherapy for skin tag removal. General Health Maintenance During their annual wellness visit, we discussed the importance of regular screenings and vaccinations. We recommended an annual dental check-up and follow-up with an eye doctor. We will order a general health panel, recommend an annual dental check-up, and recommend follow-up with an eye doctor.  Follow-up We will schedule a follow-up appointment on January 08, 2023.  Today's Health Maintenance Counseling and Anticipatory Guidance:  Sinus health:  We encouraged sterile saline nasal misting sinus rinses daily for pollen, to reduce allergies and risk for sinus infections.   Sterile can based misting products are recommended due to superior misting and ease of maintaining sterility\ Sleep Apnea screening:  We encourage self monitoring of sleep quality with SnoreLab App and other tools/apps that are now available at retail [x]  Do you snore loudly [x]  Tired, fatigued, or sleepy during the daytime []  Witness apneas []  Significant hypertension []  BMI greater than 35    []  Age older than 40 years old [x]  Has large neck size over 15.7 in [x]  Male Total  Score: 4  encouraged patient to do sleep study but he will wait and do snore lab due to we can't do before deductible resets. He will try to get on next [place] visit. Cardiovascular Risk Factor Reduction:   Advised patient of need for regular exercise and diet rich and fruits and vegetables and healthy fats to reduce risk of heart attack and stroke.  Avoid first- and second-hand smoke and stimulants.   Avoid extreme exercise- exercise in moderation (150 minutes per week is a good goal) Discussed health benefits of physical activity, and encouraged him to engage in regular exercise appropriate for his age and condition. Exercise Activities and Dietary recommendations  Goals   None    Wt Readings from Last 3 Encounters:  12/26/22 173 lb 9.6 oz (78.7 kg)  03/27/22 172 lb (78 kg)  03/14/22 170 lb 12.8 oz (77.5 kg)  Body mass index is 29.8 kg/m. Health maintenance and immunizations reviewed and he was encouraged to complete anything that is due: Immunization History  Administered Date(s) Administered   Influenza, Seasonal, Injecte, Preservative Fre 12/26/2022   Tdap 12/26/2022   Unspecified SARS-COV-2 Vaccination 08/02/2019, 09/05/2019   Health Maintenance Due  Topic Date Due   HIV Screening  Never done   Hepatitis C Screening  Never done    Health Maintenance  Topic Date Due   HIV Screening  Never done   Hepatitis C Screening  Never done   COVID-19 Vaccine (3 - 2024-25 season) 01/11/2023 (Originally 09/10/2022)   DTaP/Tdap/Td (2 - Td or Tdap) 12/25/2032   INFLUENZA VACCINE  Completed   HPV VACCINES  Aged Out    Lifestyle / Social History Reviewed:  Social History   Tobacco Use   Smoking status: Never  Substance Use Topics   Alcohol use: Never   Drug use:  Never    My recommendation is total abstinence from all substances of abuse including smoke and 2nd hand smoke, alcohol, illicit drugs, smoking, inhalants, sugar, high risk sexual behavior Offered to assist with any use disorders or  addictions.   Sexual transmitted infection testing offered today, but patient declined as he feels he is low risk based on his sexual history.   Social History   Substance and Sexual Activity  Sexual Activity Yes   Birth control/protection: Surgical   Comment: vasectomy  .      12/26/2022    8:02 AM  Depression screen PHQ 2/9  Decreased Interest 0  Down, Depressed, Hopeless 0  PHQ - 2 Score 0   Injury prevention: Discussed not texting and driving.  Today's Cancer Screening  Penile/Testicle/Scrotum cancer screening: Asked the patient to self-monitor for genital tumors. Patient reports there are none. Thyroid cancer screening: patient advised to check by palpating thyroid for nodules Prostate cancer screening:  Denies family history of prostate cancer or hematospermia so too young for screening by current guidelines.(No results found for: "PSA") Colon cancer screening:    Denies strong family history of colon cancer or blood in stool so no screening is indicated until age 39.   Lung cancer screening:  Current guidelines recommend Individuals aged 81 to 84 who currently smoke or formerly smoked and have a >= 20 pack-year smoking history should undergo annual screening with low-dose computed tomography (LDCT). Skin cancer screening-  Advised regular sunscreen use. He denies worrisome, changing, or new skin lesions. Showed him pictures of melanomas for reference:   Return to care in 1 year for next preventative visit.      Subjective  42 y.o. male presents today for a complete physical exam.   HPI  AI-Extracted: Discussed the use of AI scribe software for clinical note transcription with the patient, who gave verbal consent to proceed.  History of Present Illness The patient, an overweight individual with a history of migraines and gastric issues, presents with concerns about an elevated heart rate during physical activity, knee pain, and persistent heartburn. The patient reports  experiencing headaches on one side during dancing and a heart rate of 160-170 bpm during brisk walking, which is significantly higher than their peers. The patient also mentions a recent increase in knee pain, which they attribute to frequent use of stairs in their new residence.  The patient has been managing their migraines with medication and reports that these episodes are infrequent. However, they continue to experience gastric discomfort, for which they have been taking medication for the past 15-20 years. The patient underwent an endoscopy 3-4 years ago in [place], but the gastric issues persist.  The patient also reports experiencing symptoms suggestive of reactive hypoglycemia for the past 10-12 years. They describe feeling shaky and sweaty if they delay their meals, particularly lunch, by even 30 minutes. This issue has not been previously diagnosed or managed.  The patient has a history of vitamin D and B12 deficiencies, likely related to their vegetarian diet. They also report a high heart rate during exercise, chronic knee pain, and weight disorder. The patient has a history of esophagitis, proven on a prior endoscopy done in [place] three years ago.  The patient also mentions a skin condition characterized by multiple skin tags, which they believe may be due to a virus. They express interest in having these removed. They also have a surgical scar from a procedure performed 30 years ago, which they report was poorly done,  leading to a prominent scar.  The patient is a vegetarian and acknowledges being overweight. They report walking 4-5 miles regularly, although this has been interrupted in the past two months. Despite their efforts, they have not been able to lose weight. They also report snoring and suspect they may have sleep apnea, although this has not been formally diagnosed.   - Migraine - Tachycardia - Chronic knee pain - Overweight - Esophagitis Review of Systems  Constitutional:   Negative for chills, diaphoresis, fever, malaise/fatigue and weight loss.  HENT:  Negative for congestion, ear discharge, ear pain, hearing loss, nosebleeds, sinus pain, sore throat and tinnitus.   Eyes:  Negative for blurred vision, double vision, photophobia, pain, discharge and redness.  Respiratory:  Negative for cough, hemoptysis, sputum production, shortness of breath, wheezing and stridor.   Cardiovascular:  Negative for chest pain, palpitations, orthopnea, claudication, leg swelling and PND.  Gastrointestinal:  Positive for heartburn. Negative for abdominal pain, blood in stool, constipation, diarrhea, melena, nausea and vomiting.  Genitourinary:  Negative for dysuria, flank pain, frequency, hematuria and urgency.  Musculoskeletal:  Positive for joint pain. Negative for back pain, falls, myalgias and neck pain.  Skin:  Negative for itching and rash.  Neurological:  Negative for dizziness, tingling, tremors, sensory change, speech change, focal weakness, seizures, loss of consciousness, weakness and headaches.  Endo/Heme/Allergies:  Positive for environmental allergies. Negative for polydipsia. Does not bruise/bleed easily.  Psychiatric/Behavioral:  Negative for depression, hallucinations, memory loss, substance abuse and suicidal ideas. The patient is not nervous/anxious and does not have insomnia.    Disclaimer about ROS at Annual Preventive Visits Patients are informed before the Review of Systems (ROS) that identifying significant medical issues during the wellness visit may require immediate attention, potentially resulting in a separate billable encounter beyond the scope of the preventive exam. This disclosure is mandated by professional ethics and legal obligations, as healthcare providers must address any substantial health concerns raised during any patient interaction.  A comprehensive ROS is required by insurance companies for billing the visit. However, this structure may  inadvertently discourage patients from fully disclosing health concerns due to potential financial implications. Consequently, patients often emphasize that any positive ROS findings are related to stable chronic conditions, requesting that these not be discussed during the preventive visit to avoid additional charges. Patients may also ask that reported complaints not be listed in the ROS to prevent affecting billing.  Problem list overviews that were updated at today's visit: Problem  Tachycardia  Chronic Pain of Both Knees  Weight Disorder  Hyperlipidemia  Metabolic Syndrome  Vitamin D Deficiency  Vitamin B12 Deficiency  Vegetarian Diet  Reactive Hypoglycemia  Hx of Migraines  Encounter for Annual Physical Exam (Resolved)   I attest that I have reviewed and confirmed the patients current medications to meet the medication reconciliation requirement - not really taking these but has Current Outpatient Medications on File Prior to Visit  Medication Sig   fluticasone (FLONASE) 50 MCG/ACT nasal spray Place 2 sprays into both nostrils daily.   UNABLE TO FIND Esomeprazole 40mg  + Levosulpiride 75mg  (from [place]).   SUMAtriptan (IMITREX) 50 MG tablet Take 1 tablet (50 mg total) by mouth daily as needed for migraine. May repeat in 2 hours if headache persists or recurs. (Patient not taking: Reported on 12/26/2022)   No current facility-administered medications on file prior to visit.   Medications Discontinued During This Encounter  Medication Reason   predniSONE (DELTASONE) 20 MG tablet    amoxicillin-clavulanate (  AUGMENTIN) 875-125 MG tablet    benzonatate (TESSALON) 100 MG capsule    guaiFENesin (MUCINEX) 600 MG 12 hr tablet    Saline (SIMPLY SALINE) 0.9 % AERS    loratadine (CLARITIN) 10 MG tablet Completed Course   Outpatient Medications Prior to Visit  Medication Sig   fluticasone (FLONASE) 50 MCG/ACT nasal spray Place 2 sprays into both nostrils daily.   UNABLE TO FIND  Esomeprazole 40mg  + Levosulpiride 75mg  (from [place]).   [DISCONTINUED] amoxicillin-clavulanate (AUGMENTIN) 875-125 MG tablet Take 1 tablet by mouth 2 (two) times daily.   [DISCONTINUED] benzonatate (TESSALON) 100 MG capsule Take twice daily as needed by mouth   [DISCONTINUED] guaiFENesin (MUCINEX) 600 MG 12 hr tablet Take 2 tablets (1,200 mg total) by mouth 2 (two) times daily.   [DISCONTINUED] loratadine (CLARITIN) 10 MG tablet Take 1 tablet (10 mg total) by mouth daily.   [DISCONTINUED] Saline (SIMPLY SALINE) 0.9 % AERS Place 2 each into the nose as directed. Use nightly for sinus hygiene long-term.  Can also be used as many times daily as desired to assist with clearing congested sinuses.   SUMAtriptan (IMITREX) 50 MG tablet Take 1 tablet (50 mg total) by mouth daily as needed for migraine. May repeat in 2 hours if headache persists or recurs. (Patient not taking: Reported on 12/26/2022)   [DISCONTINUED] predniSONE (DELTASONE) 20 MG tablet Take 2 pills in the morning with breakfast for 3 days, then 1 pill for 2 days   No facility-administered medications prior to visit.  The following were reviewed and/or entered/updated into our electronic MEDICAL RECORD NUMBERPast Medical History:  Diagnosis Date   Encounter for annual physical exam 12/08/2021   GERD (gastroesophageal reflux disease) 01/10/2007   Hyperlipidemia 12/26/2022   Ulcer 01/10/2007   Acidity issue on esophageal   Past Surgical History:  Procedure Laterality Date   APPENDECTOMY     COSMETIC SURGERY     Gynacomastia   VASECTOMY     Social History   Socioeconomic History   Marital status: Married    Spouse name: Not on file   Number of children: Not on file   Years of education: Not on file   Highest education level: Not on file  Occupational History   Not on file  Tobacco Use   Smoking status: Never   Smokeless tobacco: Not on file  Substance and Sexual Activity   Alcohol use: Never   Drug use: Never   Sexual activity:  Yes    Birth control/protection: Surgical    Comment: vasectomy  Other Topics Concern   Not on file  Social History Narrative   Not on file   Social Drivers of Health   Financial Resource Strain: Not on file  Food Insecurity: Not on file  Transportation Needs: Not on file  Physical Activity: Not on file  Stress: Not on file  Social Connections: Not on file  Intimate Partner Violence: Not on file   Family Status  Relation Name Status   Mother Willaim Sheng Laxmi Alive   Father Anjaneyulu Alive   Sister  Alive   Sister  Alive   Brother  Alive   Son Shritan Meester Alive  No partnership data on file   Family History  Problem Relation Age of Onset   Hypertension Mother    Diabetes Father    Arthritis Father    Asthma Son    Allergies  Allergen Reactions   Other Other (See Comments)   Patient Care Team: Lula Olszewski, MD as  PCP - General (Internal Medicine)      Objective  BP 129/87   Pulse 98   Temp 98.3 F (36.8 C) (Temporal)   Ht 5\' 4"  (1.626 m)   Wt 173 lb 9.6 oz (78.7 kg)   SpO2 98%   BMI 29.80 kg/m  BP Readings from Last 3 Encounters:  12/26/22 129/87  03/27/22 131/88  03/14/22 120/86   Wt Readings from Last 3 Encounters:  12/26/22 173 lb 9.6 oz (78.7 kg)  03/27/22 172 lb (78 kg)  03/14/22 170 lb 12.8 oz (77.5 kg)      Physical Exam     GEN: NAD, Resting Comfortably. HEENT: Hearing grossly intact. Vision intact. Red spots observed at the bottom of both ear canals. Teeth in need of descaling. Nasal mucosa swollen, particularly on the left side, suggesting allergies. Reduced airflow through the left nostril. NECK: No lymphadenopathy. Skin tags EXTREMITIES: Possible fungal infection of toenails suggested. MUSCULOSKELETAL: No abnormalities noted in spine upon inspection. SKIN: Presence of spots, likely skin tags, noted. Scar from previous surgery described as poor quality. Multiple skin tags or HPV-related lesions suggested for removal with  cryotherapy. CARDIOVASCULAR: S1 and S2 heart sounds have regular rate and rhythm with no murmurs appreciated. PULMONARY:  Normal work of breathing. Clear to auscultation bilaterally with no crackles, wheezes, or rhonchi. ABDOMEN: Soft, Nontender, Nondistended.  MSK: No edema, cyanosis, or clubbing noted. SKIN: Warm, dry, no lesions of concern observed. NEURO: CN2-12 grossly intact. Strength 5/5 in upper and lower extremities. Reflexes symmetric and intact bilaterally.  PSYCH: Normal affect and thought content, pleasant and cooperative. Photographs Taken 12/26/2022 :           Last depression screening scores    12/26/2022    8:02 AM 03/14/2022   11:51 AM 12/08/2021    9:33 AM  PHQ 2/9 Scores  PHQ - 2 Score 0 0 0   Last fall risk screening    12/26/2022    8:02 AM  Fall Risk   Falls in the past year? 0  Number falls in past yr: 0  Injury with Fall? 0  Risk for fall due to : No Fall Risks   Last Audit-C alcohol use screening     No data to display        Not drinking. Last CBC Lab Results  Component Value Date   WBC 6.0 12/26/2022   HGB 16.3 12/26/2022   HCT 48.5 12/26/2022   MCV 86.2 12/26/2022   RDW 13.5 12/26/2022   PLT 367.0 12/26/2022   Last metabolic panel Lab Results  Component Value Date   GLUCOSE 86 12/26/2022   NA 137 12/26/2022   K 4.3 12/26/2022   CL 101 12/26/2022   CO2 30 12/26/2022   BUN 8 12/26/2022   CREATININE 0.77 12/26/2022   GFR 111.92 12/26/2022   CALCIUM 9.2 12/26/2022   PROT 7.6 12/26/2022   ALBUMIN 4.4 12/26/2022   BILITOT 0.4 12/26/2022   ALKPHOS 88 12/26/2022   AST 21 12/26/2022   ALT 31 12/26/2022   Last lipids Lab Results  Component Value Date   CHOL 222 (H) 12/26/2022   HDL 50.60 12/26/2022   LDLCALC 135 (H) 12/26/2022   TRIG 178.0 (H) 12/26/2022   CHOLHDL 4 12/26/2022   Last hemoglobin A1c Lab Results  Component Value Date   HGBA1C 5.8 12/26/2022   Last thyroid functions Lab Results  Component Value  Date   TSH 0.98 12/08/2021   Last vitamin D Lab Results  Component  Value Date   VD25OH 12.85 (L) 12/26/2022   Last vitamin B12 and Folate Lab Results  Component Value Date   VITAMINB12 206 (L) 12/26/2022        Lula Olszewski, MD  [facility] [phone] (phone) [phone] (fax) [facility]

## 2022-12-26 NOTE — Assessment & Plan Note (Signed)
Tachycardia only with exertion. More than expected. Likely deconditioning They report an elevated heart rate of 160-170 bpm during moderate exercise, compared to peers with heart rates around 120 bpm, but have no symptoms of chest pain or shortness of breath. We discussed potential heart disease causes and decided on a coronary CT scan (calcium score) as a cost-effective assessment method, costing $100 out-of-pocket. We also considered an EKG and a Zio heart monitor, the latter providing 14 days of data, though it may not be feasible before year-end. We will order an EKG and a coronary CT scan.

## 2022-12-26 NOTE — Assessment & Plan Note (Signed)
Weight Disorder Their overweight status contributes to knee pain and potentially other metabolic issues. We discussed the importance of weight loss for overall health improvement and recommend weight loss.

## 2022-12-26 NOTE — Assessment & Plan Note (Signed)
Metabolic Syndrome They have borderline HbA1c, high cholesterol, and central obesity. We emphasized the importance of monitoring and managing these risk factors and explained that HbA1c and a lipid panel are essential for ongoing assessment. We will order HbA1c and a lipid panel.

## 2022-12-26 NOTE — Assessment & Plan Note (Signed)
Esophagitis They have ongoing heartburn despite long-term medication use and a previous endoscopy done in [place]. We discussed the need for a repeat endoscopy to assess the current condition and adjust treatment as necessary. We will refer them to gastroenterology for a repeat endoscopy.

## 2022-12-26 NOTE — Patient Instructions (Signed)
Managing Your Health Over Time  Managing every aspect of your health in a single visit isn't always feasible, but that's okay.  We addressed many preventive issues today and charted a course for future care. Acute conditions or preventive care measures may require further attention.  We encourage you to schedule a follow-up visit at your earliest convenience to discuss any unresolved issues.  We strongly encourage continued participation in annual preventive care visits to help [place] develop a more thorough understanding of your health and to help you maintain optimal wellness - please inquire about scheduling your next one with [place] at your earliest convenience.  VISIT SUMMARY:  During today's visit, we discussed several health concerns, including your elevated heart rate during exercise, knee pain, persistent heartburn, and other ongoing issues. We reviewed your history of migraines, gastric issues, and potential reactive hypoglycemia. We also addressed your concerns about skin tags and a surgical scar. We have outlined a comprehensive plan to address each of these issues and recommended several tests and lifestyle changes to improve your overall health.  YOUR PLAN:  -TACHYCARDIA: Tachycardia means having a faster than normal heart rate. We will order an EKG and a coronary CT scan to assess your heart health and determine the cause of your elevated heart rate during exercise.  -METABOLIC SYNDROME: Metabolic syndrome is a group of conditions that increase the risk of heart disease, stroke, and diabetes. We will monitor your HbA1c and lipid levels to manage these risk factors. We will order HbA1c and a lipid panel.  -REACTIVE HYPOGLYCEMIA: Reactive hypoglycemia is low blood sugar that occurs after eating. We recommend using a blood glucose monitor to confirm the diagnosis. We will order a blood glucose monitor.  -WEIGHT DISORDER: Being overweight can contribute to various health issues, including knee  pain and metabolic problems. We recommend focusing on weight loss to improve your overall health.  -CHRONIC KNEE PAIN: Chronic knee pain can be caused by weight and physical activity. We recommend weight loss to reduce the strain on your knees and potentially delay the need for future surgical intervention.  -VITAMIN D DEFICIENCY: Vitamin D deficiency can occur due to dietary restrictions. We will monitor your vitamin D levels and recommend supplements if necessary. We will order a vitamin D level.  -VITAMIN B12 DEFICIENCY: Vitamin B12 deficiency can occur due to dietary restrictions. We will monitor your vitamin B12 levels and recommend supplements if necessary. We will order a vitamin B12 level.  -ESOPHAGITIS: Esophagitis is inflammation of the esophagus, often causing heartburn. We will refer you to gastroenterology for a repeat endoscopy to assess your condition and adjust treatment as necessary.  -ALLERGIC RHINITIS: Allergic rhinitis is inflammation of the nasal passages due to allergies. We recommend a daily sinus rinse and will prescribe Flonase to reduce your symptoms.  -ONYCHOMYCOSIS: Onychomycosis is a fungal infection of the toenails. We will prescribe antifungal cream to improve the appearance and health of your nails.  -SKIN TAGS: Skin tags are small, benign growths on the skin. We will schedule cryotherapy to remove them, noting the potential for minor scarring.  -GENERAL HEALTH MAINTENANCE: We discussed the importance of regular screenings and vaccinations. We recommend an annual dental check-up and follow-up with an eye doctor. We will order a general health panel.  INSTRUCTIONS:  Please follow up with the recommended tests and specialists as discussed. We will schedule a follow-up appointment on January 08, 2023.   Next Steps  [x]   Schedule Follow-Up:  We recommend a follow-up appointment  in 1 year for your next wellness visit.  If you develop any new problems, want to  address any medical issues, or your condition worsens before then, please call us for an appointment or seek emergency care. [x]   Preventive Care:  Make sure to keep regular appointments with dental and vision professionals, use nightly nasal saline mist sprays to keep your sinuses clear and toothbrushing to protect your teeth. Use [employer] App or other app to track your sleep quality. Check blood pressure and heart rate routinely. [x]   Medical Information Release:  For any relevant medical information we don't have, please sign a release form at the front desk so we can obtain it for your records. [x]   Lab Tests:  Schedule any lab tests from today for within a week to ensure best insurance coverage.    Making the Most of Our Focused (20 minute) Appointments:  [x]   Clearly state your top concerns at the beginning of the visit to focus our discussion [x]   If you anticipate you will need more time, please inform the front desk during scheduling - we can book multiple appointments in the same week. [x]   If you have transportation problems- use our convenient video appointments or ask about transportation support. [x]   We can get down to business faster if you use MyChart to update information before the visit and submit non-urgent questions before your visit. Thank you for taking the time to provide details through MyChart.  Let our nurse know and she can import this information into your encounter documents.  Arrival and Wait Times: [x]   Arriving on time ensures that everyone receives prompt attention. [x]   Early morning (8a) and afternoon (1p) appointments tend to have shortest wait times. [x]   Unfortunately, we cannot delay appointments for late arrivals or hold slots during phone calls.  Bring to Your Next Appointment  [x]   Medications: Please bring all your medication bottles to your next appointment to ensure we have an accurate record of your prescriptions. [x]   Health Diaries: If you're  monitoring any health conditions at home, keeping a diary of your readings can be very helpful for discussions at your next appointment.  Reviewing Your Records  [x]   Review your attached preventive care information at the end of these patient instructions. [x]   Review this early draft of your clinical encounter notes below and the final encounter summary tomorrow on MyChart after its been completed.   Encounter for annual health examination  Tachycardia -     Lipid panel -     Comprehensive metabolic panel -     CBC with Differential/Platelet -     TSH Rfx on Abnormal to Free T4 -     Hemoglobin A1c -     CT CARDIAC SCORING (SELF PAY ONLY); Future  Chronic pain of both knees -     Lipid panel -     Comprehensive metabolic panel -     CBC with Differential/Platelet -     TSH Rfx on Abnormal to Free T4 -     Hemoglobin A1c  Weight disorder -     Lipid panel -     Comprehensive metabolic panel -     CBC with Differential/Platelet -     TSH Rfx on Abnormal to Free T4 -     Hemoglobin A1c  Flu vaccine need -     Flu vaccine trivalent PF, 6mos and older(Flulaval,Afluria,Fluarix,Fluzone) -     Lipid panel -  Comprehensive metabolic panel -     CBC with Differential/Platelet -     TSH Rfx on Abnormal to Free T4 -     Hemoglobin A1c  Gastroesophageal reflux disease without esophagitis -     Lipid panel -     Comprehensive metabolic panel -     CBC with Differential/Platelet -     TSH Rfx on Abnormal to Free T4 -     Hemoglobin A1c -     Ambulatory referral to Gastroenterology  Metabolic syndrome -     Lipid panel -     Comprehensive metabolic panel -     CBC with Differential/Platelet -     TSH Rfx on Abnormal to Free T4 -     Hemoglobin A1c -     CT CARDIAC SCORING (SELF PAY ONLY); Future  Hyperlipidemia, unspecified hyperlipidemia type -     Lipid panel -     Comprehensive metabolic panel -     CBC with Differential/Platelet -     TSH Rfx on Abnormal to Free  T4 -     Hemoglobin A1c -     CT CARDIAC SCORING (SELF PAY ONLY); Future  Vitamin D deficiency -     Lipid panel -     Comprehensive metabolic panel -     CBC with Differential/Platelet -     TSH Rfx on Abnormal to Free T4 -     Hemoglobin A1c -     VITAMIN D 25 Hydroxy (Vit-D Deficiency, Fractures)  Vitamin B12 deficiency -     Lipid panel -     Comprehensive metabolic panel -     CBC with Differential/Platelet -     TSH Rfx on Abnormal to Free T4 -     Hemoglobin A1c -     Vitamin B12  Vegetarian diet -     Lipid panel -     Comprehensive metabolic panel -     CBC with Differential/Platelet -     TSH Rfx on Abnormal to Free T4 -     Hemoglobin A1c -     VITAMIN D 25 Hydroxy (Vit-D Deficiency, Fractures) -     Vitamin B12  Reactive hypoglycemia -     Lipid panel -     Comprehensive metabolic panel -     CBC with Differential/Platelet -     TSH Rfx on Abnormal to Free T4 -     Hemoglobin A1c -     Blood Glucose Monitoring Suppl; 1 each by Does not apply route in the morning, at noon, and at bedtime. May substitute to any manufacturer covered by patient's insurance.  Dispense: 1 each; Refill: 0 -     Blood Glucose Test; 1 each by In Vitro route in the morning, at noon, and at bedtime. May substitute to any manufacturer covered by patient's insurance.  Dispense: 100 strip; Refill: 0 -     Lancet Device; 1 each by Does not apply route in the morning, at noon, and at bedtime. May substitute to any manufacturer covered by patient's insurance.  Dispense: 1 each; Refill: 0 -     Lancets Misc.; 1 each by Does not apply route in the morning, at noon, and at bedtime. May substitute to any manufacturer covered by patient's insurance.  Dispense: 100 each; Refill: 0  Screen for STD (sexually transmitted disease) -     HIV Antibody (routine testing w rflx) -     Hepatitis C antibody  Screening examination for infectious disease -     HIV Antibody (routine testing w rflx) -      Hepatitis C antibody  Onychomycosis -     Ciclopirox Olamine; Apply topically 2 (two) times daily.  Dispense: 90 g; Refill: 3  Encounter for annual general medical examination with abnormal findings in adult  Preventative health care  Need for influenza vaccination  Need for Tdap vaccination -     Tdap vaccine greater than or equal to 7yo IM  Allergic rhinitis, unspecified seasonality, unspecified trigger  Skin tags, multiple acquired     Getting Answers and Following Up  [x]   Simple Questions & Concerns: For quick questions or basic follow-up after your visit, reach [place] at [phone] or MyChart messaging. [x]   Complex Concerns: If your concern is more complex, scheduling an appointment might be best. Discuss this with the staff to find the most suitable option. [x]   Lab & Imaging Results: We'll contact you directly if results are abnormal or you don't use MyChart. Most normal results will be on MyChart within 2-3 business days, with a review message from Dr. Jon Billings. Haven't heard back in 2 weeks? Need results sooner? Contact us at [phone]. [x]   Referrals: Our referral coordinator will manage specialist referrals. The specialist's office should contact you within 2 weeks to schedule an appointment. Call us if you haven't heard from them after 2 weeks.  Staying Connected  [x]   MyChart: Activate your MyChart for the fastest way to access results and message [place]. See the last page of this paperwork for instructions on how to activate.  Billing  [x]   X-ray & Lab Orders: These are billed by separate companies. Contact the invoicing company directly for questions or concerns. [x]   Visit Charges: Discuss any billing inquiries with our administrative services team.  Your Satisfaction Matters  [x]   Share Your Experience: We strive for your satisfaction! If you have any complaints, or preferably compliments, please let Dr. Jon Billings know directly or contact our Practice  Administrators, Edwena Felty or [employer], by asking at the front desk.

## 2022-12-26 NOTE — Assessment & Plan Note (Signed)
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 We discussed using a blood glucose monitor to confirm the diagnosis, noting that insurance might not cover the cost. We will order a blood glucose monitor.

## 2022-12-26 NOTE — Assessment & Plan Note (Signed)
Chronic Knee Pain They have bilateral knee pain exacerbated by increased activity and stair climbing, likely related to weight and physical activity. We discussed that weight loss could reduce knee strain and potentially delay the need for future surgical intervention and recommend weight loss to reduce knee strain.

## 2022-12-26 NOTE — Assessment & Plan Note (Signed)
Vitamin B12 Deficiency Their vegetarian diet likely causes vitamin B12 deficiency. We discussed the importance of monitoring vitamin B12 levels, especially given dietary restrictions. We will order a vitamin B12 level.

## 2022-12-26 NOTE — Assessment & Plan Note (Signed)
Onychomycosis They have a possible fungal infection of the toenails. We discussed antifungal cream as a treatment option to improve nail appearance and health. We will prescribe antifungal cream.

## 2022-12-26 NOTE — Assessment & Plan Note (Signed)
Vitamin D Deficiency Their vegetarian diet likely causes vitamin D deficiency. We discussed the importance of monitoring vitamin D levels, especially given dietary restrictions. We will order a vitamin D level.

## 2022-12-26 NOTE — Assessment & Plan Note (Signed)
Allergic Rhinitis They have nasal congestion and swelling, particularly on the left side, likely due to allergies. We discussed daily sinus rinse and Flonase as effective treatments to reduce symptoms. We recommend a daily sinus rinse and will prescribe Flonase.

## 2022-12-26 NOTE — Progress Notes (Signed)
Reviewed labs from 12/26/2022. Labs show worsening cholesterol (Total 222, LDL 135) and significant vitamin deficiencies (D: 12.85, B12: 206). HbA1c slightly increased to 5.8%. Normal kidney and liver function. Coronary calcium score ordered to assess heart health. Gastroenterology referral placed. See detailed explanation in Patient Message.

## 2022-12-27 LAB — HEPATITIS C ANTIBODY: Hepatitis C Ab: NONREACTIVE

## 2022-12-27 LAB — HIV ANTIBODY (ROUTINE TESTING W REFLEX): HIV 1&2 Ab, 4th Generation: NONREACTIVE

## 2022-12-27 NOTE — Telephone Encounter (Signed)
Seen by pt on 12/26/22 at 7:08pm

## 2023-01-01 ENCOUNTER — Ambulatory Visit (HOSPITAL_COMMUNITY)
Admission: RE | Admit: 2023-01-01 | Discharge: 2023-01-01 | Disposition: A | Discharge status: Home / Self Care | Payer: Self-pay | Source: Ambulatory Visit | Attending: Internal Medicine | Admitting: Internal Medicine

## 2023-01-01 DIAGNOSIS — E8881 Metabolic syndrome: Secondary | ICD-10-CM | POA: Insufficient documentation

## 2023-01-01 DIAGNOSIS — R000 Tachycardia, unspecified: Secondary | ICD-10-CM | POA: Insufficient documentation

## 2023-01-01 DIAGNOSIS — E785 Hyperlipidemia, unspecified: Secondary | ICD-10-CM | POA: Insufficient documentation

## 2023-01-01 LAB — TSH RFX ON ABNORMAL TO FREE T4: TSH: 0.718 u[IU]/mL (ref 0.450–4.500)

## 2023-01-05 ENCOUNTER — Encounter: Payer: Self-pay | Admitting: Internal Medicine

## 2023-01-05 DIAGNOSIS — R911 Solitary pulmonary nodule: Secondary | ICD-10-CM | POA: Insufficient documentation

## 2023-01-05 NOTE — Progress Notes (Signed)
Reviewed CT coronary calcium score from 01/03/2023. Score is 0, indicating very low cardiovascular risk. Incidental finding of small pulmonary nodules, all 5mm or less, likely benign. See detailed explanation in Patient Message.

## 2023-01-08 ENCOUNTER — Ambulatory Visit (INDEPENDENT_AMBULATORY_CARE_PROVIDER_SITE_OTHER): Payer: 59 | Admitting: Internal Medicine

## 2023-01-08 ENCOUNTER — Telehealth: Payer: Self-pay | Admitting: Internal Medicine

## 2023-01-08 ENCOUNTER — Encounter: Payer: Self-pay | Admitting: Internal Medicine

## 2023-01-08 VITALS — BP 122/88 | HR 100 | Temp 97.9°F | Ht 64.0 in | Wt 178.0 lb

## 2023-01-08 DIAGNOSIS — E7849 Other hyperlipidemia: Secondary | ICD-10-CM

## 2023-01-08 DIAGNOSIS — E8881 Metabolic syndrome: Secondary | ICD-10-CM

## 2023-01-08 DIAGNOSIS — R911 Solitary pulmonary nodule: Secondary | ICD-10-CM | POA: Diagnosis not present

## 2023-01-08 DIAGNOSIS — L918 Other hypertrophic disorders of the skin: Secondary | ICD-10-CM | POA: Insufficient documentation

## 2023-01-08 DIAGNOSIS — E569 Vitamin deficiency, unspecified: Secondary | ICD-10-CM | POA: Insufficient documentation

## 2023-01-08 DIAGNOSIS — R000 Tachycardia, unspecified: Secondary | ICD-10-CM

## 2023-01-08 MED ORDER — ROSUVASTATIN CALCIUM 10 MG PO TABS: 10.0000 mg | ORAL_TABLET | Freq: Every day | ORAL | 3 refills | Status: AC

## 2023-01-08 NOTE — Assessment & Plan Note (Signed)
Procedure performed: Cryotherapy to multiple skin tags Total of 15 lesions treated with freeze-thaw cycles Post-procedure care instructions provided:  Avoid picking or traumatizing treated areas Use ibuprofen and topical lidocaine as needed Monitor for healing over next 2 weeks Return in 3-4 weeks for any residual lesions

## 2023-01-08 NOTE — Assessment & Plan Note (Signed)
Complex presentation with prediabetes (A1c 5.8), dyslipidemia, and central obesity Comprehensive lifestyle modification program initiated:  Low-carbohydrate diet with focus on protein sources compatible with vegetarian diet Resistance training with resistance bands for muscle building Elimination of sugary beverages and refined carbohydrates Will reassess metabolic parameters in 3 months Patient education provided regarding metabolic syndrome management

## 2023-01-08 NOTE — Assessment & Plan Note (Signed)
Vitamin D 12.85 (Low) B12 206 (Low) Continue supplementation:  Vitamin D 2000 IU daily B12 supplementation Recheck levels in 3 months

## 2023-01-08 NOTE — Assessment & Plan Note (Signed)
Coronary calcium score 0 indicates very low cardiovascular risk Tachycardia likely due to deconditioning rather than cardiac pathology Plan:  Begin graduated exercise program Monitor heart rate during exercise No cardiology referral needed given benign findings

## 2023-01-08 NOTE — Assessment & Plan Note (Signed)
Total cholesterol 222, Triglycerides 178, LDL 135 Currently using red yeast rice extract Agreed to trial of statin therapy given overall cardiovascular risk profile Will monitor for side effects and tolerance

## 2023-01-08 NOTE — Patient Instructions (Addendum)
AFTER VISIT SUMMARY  Today's Treatments: - Cryotherapy performed for skin tag removal - Started on new exercise and diet program - Initiated statin therapy  Medication Instructions: 1. Continue Vitamin D 2000 IU daily 2. Continue B12 supplementation 3. New statin medication - take as prescribed 4. Use over-the-counter lidocaine ointment and ibuprofen as needed for skin tag treatment sites  Skin Tag Care Instructions: - DO NOT pick or scratch treated areas - Keep areas clean and dry - Apply lidocaine ointment if uncomfortable - Take ibuprofen for discomfort - Call if severe pain, redness, or signs of infection develop - Areas should heal and fall off within 2 weeks  Diet and Exercise Plan: - Follow low-carbohydrate diet - Focus on protein sources:   * Eggs (up to 5 per week)   * Tofu   * Lentils   * Dairy products (consider lactose-free options) - Replace white rice with vegetables - Use stevia instead of honey or agave for sweetening - Start resistance band exercises - Begin gradual exercise program  Warning Signs - Call If: - Severe pain or swelling at treatment sites - Fever develops - Chest pain or severe shortness of breath with exercise - Dizziness or fainting  Follow-up Appointments: 1. Return in 3-4 weeks for skin tag check 2. Schedule CT scan in one year 3. Lab work in 3 months (optional)  Remember: Heart testing showed no blockages - high heart rate with exercise is due to being out of shape. Start exercise program gradually and build up slowly.      Analysis of Incidental Pulmonary Nodules: Your recent CT scan revealed multiple small pulmonary nodules in your lungs. While concerning, it's important to understand that:  Most small nodules are benign: Conditions like: Infections: Past infections (e.g., pneumonia, tuberculosis) can leave behind scars. Inflammation: Conditions like rheumatoid arthritis can affect the lungs. Environmental exposures:  Certain irritants can cause nodule formation. Benign tumors: Non-cancerous growths (e.g., hamartomas) can appear as nodules. Lung cancer is a possibility: However, the risk is generally low, especially in non-smokers.  Factors that influence the likelihood of lung cancer:  Nodule size and characteristics: Larger nodules or those with irregular shapes or margins may be more concerning. Your individual risk factors: Age: Risk increases with age. Medical history: Conditions like chronic obstructive pulmonary disease (COPD) or autoimmune diseases can increase risk. Family history of lung cancer: A strong family history increases your risk. Other risk factors: Exposure to asbestos, radon, or air pollution can increase risk. Next Steps:  Your doctor will assess your individual risk factors to determine the best course of action. This may include:  Close monitoring: Regular follow-up CT scans to monitor the nodules for any changes. Further evaluation: Tests like PET scans or bronchoscopy may be recommended to further investigate suspicious nodules. Important Note: This information is for general knowledge and discussion purposes only. Please consult with your healthcare provider for personalized medical advice and to address any specific concerns you may have.   This is for informational purposes only. For medical advice or diagnosis, consult a professional.  Absolutely! Here's a highly detailed but not overly specific plan for a vegetarian patient with metabolic syndrome and dyslipidemia who needs to lose weight:  **Dietary Guidelines**  * **Prioritize Whole Foods:** Focus on a plant-based diet rich in:     * **Fruits and Vegetables:** Aim for a variety of colors (deep greens, reds, oranges, purples) to maximize nutrient intake.      * **Whole Grains:** Choose brown rice, quinoa,  oats, barley, and whole-wheat bread over refined grains.     * **Legumes:** Include lentils, beans (black, kidney,  chickpeas), and peas regularly.     * **Nuts and Seeds:** Almonds, walnuts, chia seeds, flaxseeds - excellent sources of healthy fats and protein.  * **Healthy Fats:**     * **Unsaturated Fats:** Include avocados, olive oil, nuts, and seeds.     * **Limit Saturated and Trans Fats:** Avoid fried foods, processed snacks, and excessive dairy products. * **Protein Sources:**     * **Legumes:** As mentioned above, they're a great source of plant-based protein.     * **Tofu, Tempeh, and Edamame:** Incorporate soy products into your meals.     * **Greek Yogurt (if tolerated):** Provides protein and calcium.     * **Eggs (if included in the vegetarian diet):** A good source of protein. * **Limit Processed Foods:** Minimize intake of:     * Sugary drinks (soda, juice)     * Processed snacks (chips, cookies, candy)     * Fast food     * Packaged foods high in sodium and unhealthy fats * **Hydration:** Drink plenty of water throughout the day.   **Sample Meal Plan (Not Specific):**  * **Breakfast:** Oatmeal with berries and nuts, smoothie with spinach, protein powder, and fruit, or whole-grain toast with avocado and tomato. * **Lunch:** Large salad with grilled tofu or beans, lentil soup with whole-grain bread, or vegetable stir-fry with brown rice. * **Dinner:** Baked sweet potato with black beans and salsa, vegetable curry with quinoa, or lentil pasta with marinara sauce and vegetables. * **Snacks:** Trail mix, fruit with nut butter, Greek yogurt with berries, or a handful of almonds.  **Exercise Plan**  * **Cardio:**     * **Moderate-Intensity:** Brisk walking, swimming, cycling (aim for at least 150 minutes per week).     * **High-Intensity Interval Training (HIIT):** Short bursts of intense exercise followed by brief recovery periods (2-3 sessions per week). * **Strength Training:**     * Include exercises that work all major muscle groups (legs, arms, core) at least 2-3 times per week.       * Bodyweight exercises (squats, lunges, push-ups, planks) or resistance bands can be effective. * **Flexibility and Balance:**     * Incorporate stretching and yoga to improve flexibility and balance.  **Important Considerations**  * **Consult a Designer, fashion/clothing:** This plan is a general guideline. Work with a doctor, Museum/gallery exhibitions officer, or certified personal trainer to create a personalized plan that meets your specific needs and health conditions. * **Consistency is Key:** Adhere to the diet and exercise plan as consistently as possible for the best results.  * **Listen to Your Body:** Rest when needed and adjust the intensity or duration of exercise based on how you feel. * **Make Gradual Changes:** Start with small, manageable changes to your diet and exercise routine and gradually increase the intensity and duration over time. * **Focus on Sustainable Habits:** Choose lifestyle changes that you can maintain long-term for successful weight management.

## 2023-01-08 NOTE — Progress Notes (Signed)
==============================  [facility]: [phone]   -- Medical Office Visit --  Patient: Dylan Vazquez      Age: 42 y.o.       Sex:  male  Date:   01/08/2023 Today's Healthcare Provider: Lula Olszewski, MD  ==============================   CHIEF COMPLAINT: 2 week follow-up   SUBJECTIVE: 42 y.o. male who has Hx of migraines; Esophagitis determined by endoscopy; Gastroesophageal reflux disease without esophagitis; Tachycardia; Chronic pain of both knees; Weight disorder; Hyperlipidemia; Metabolic syndrome; Vitamin D deficiency; Vitamin B12 deficiency; Vegetarian diet; Reactive hypoglycemia; Allergic rhinitis; Onychomycosis; Incidental pulmonary nodule, > 3mm and < 8mm; Vitamin deficiency; and Skin tags, multiple acquired on their problem list.  HISTORY OF PRESENT ILLNESS: Patient presents for 2-week follow-up to review cardiac findings, evaluate recent diagnostic results, and undergo skin tag removal. Recent coronary calcium score was 0, indicating no significant coronary artery calcification. Patient has been experiencing exercise-induced tachycardia with heart rates of 160-170 BPM during moderate exercise. Recent CT imaging incidentally discovered multiple small pulmonary nodules, largest measuring 5mm in right lower lobe. Patient has documented history of three COVID-19 infections, most recently in January 2022, which may explain the nodular findings. Labs reveal metabolic syndrome with HbA1c 5.8%, elevated cholesterol (222), and triglycerides (178). Patient follows vegetarian diet with intake including lentils, tofu, paneer, eggs, and avocados. Reports afternoon sweet cravings and regular rice consumption. Currently taking vitamin D (2000 IU daily) and B12 supplementation for documented deficiencies (D: 12.85, B12: 206). Patient denies smoking or alcohol use. Exercise tolerance limited by deconditioning rather than cardiac symptoms. Recent weight  trend shows increase from 173 to 178 pounds over past two weeks. Blood pressure today 122/88 with resting heart rate 100. Patient desires skin tag removal today, with multiple lesions in neck and axillary regions causing discomfort and cosmetic concerns and potential for spread  Note that patient  has a past medical history of Encounter for annual physical exam (12/08/2021), GERD (gastroesophageal reflux disease) (01/10/2007), Hyperlipidemia (12/26/2022), and Ulcer (01/10/2007).  Problem list overviews that were updated at today's visit: Problem  Vitamin Deficiency  Skin Tags, Multiple Acquired    Med reconciliation: Current Outpatient Medications on File Prior to Visit  Medication Sig   Blood Glucose Monitoring Suppl (ONETOUCH VERIO REFLECT) w/Device KIT USE AS DIRECTED IN THE MORNING, AT NOON, AND AT BEDTIME   Blood Glucose Monitoring Suppl DEVI 1 each by Does not apply route in the morning, at noon, and at bedtime. May substitute to any manufacturer covered by patient's insurance.   ciclopirox (LOPROX) 0.77 % cream Apply topically 2 (two) times daily.   fluticasone (FLONASE) 50 MCG/ACT nasal spray Place 2 sprays into both nostrils daily.   Glucose Blood (BLOOD GLUCOSE TEST STRIPS) STRP 1 each by In Vitro route in the morning, at noon, and at bedtime. May substitute to any manufacturer covered by patient's insurance.   Lancet Device MISC 1 each by Does not apply route in the morning, at noon, and at bedtime. May substitute to any manufacturer covered by patient's insurance.   SUMAtriptan (IMITREX) 50 MG tablet Take 1 tablet (50 mg total) by mouth daily as needed for migraine. May repeat in 2 hours if headache persists or recurs.   UNABLE TO FIND Esomeprazole 40mg  + Levosulpiride 75mg  (from [place]).   No current facility-administered medications on file prior to visit.   Medications Discontinued During This Encounter  Medication Reason   Lancets Misc. MISC Duplicate   UNABLE TO FIND  Objective   Physical Exam     01/08/2023   10:22 AM 12/26/2022    7:59 AM 03/27/2022   11:38 AM  Vitals with BMI  Height 5\' 4"  5\' 4"  5\' 4"   Weight 178 lbs 173 lbs 10 oz 172 lbs  BMI 30.54 29.78 29.51  Systolic 122 129 191  Diastolic 88 87 88  Pulse 100 98 73   Wt Readings from Last 10 Encounters:  01/08/23 178 lb (80.7 kg)  12/26/22 173 lb 9.6 oz (78.7 kg)  03/27/22 172 lb (78 kg)  03/14/22 170 lb 12.8 oz (77.5 kg)  12/08/21 171 lb (77.6 kg)  01/14/21 175 lb (79.4 kg)  11/26/20 179 lb (81.2 kg)  12/06/19 176 lb 5.9 oz (80 kg)   Vital signs reviewed.  Nursing notes reviewed. Weight trend reviewed. Abnormalities and Problem-Specific physical exam findings:  truncal adiposity, extensive skin tags arm pits, abdomen and neck  General Appearance:  No acute distress appreciable.   Well-groomed, healthy-appearing male.  Well proportioned with no abnormal fat distribution.  Good muscle tone. Pulmonary:  Normal work of breathing at rest, no respiratory distress apparent. SpO2: 100 %  Musculoskeletal: All extremities are intact.  Neurological:  Awake, alert, oriented, and engaged.  No obvious focal neurological deficits or cognitive impairments.  Sensorium seems unclouded.   Speech is clear and coherent with logical content. Psychiatric:  Appropriate mood, pleasant and cooperative demeanor, thoughtful and engaged during the exam    No results found for any visits on 01/08/23. Office Visit on 12/26/2022  Component Date Value   HIV 1&2 Ab, 4th Generati* 12/26/2022 NON-REACTIVE    Hepatitis C Ab 12/26/2022 NON-REACTIVE    Cholesterol 12/26/2022 222 (H)    Triglycerides 12/26/2022 178.0 (H)    HDL 12/26/2022 50.60    VLDL 12/26/2022 35.6    LDL Cholesterol 12/26/2022 135 (H)    Total CHOL/HDL Ratio 12/26/2022 4    NonHDL 12/26/2022 170.99    Sodium 12/26/2022 137    Potassium 12/26/2022 4.3    Chloride 12/26/2022 101    CO2 12/26/2022 30    Glucose, Bld 12/26/2022 86     BUN 12/26/2022 8    Creatinine, Ser 12/26/2022 0.77    Total Bilirubin 12/26/2022 0.4    Alkaline Phosphatase 12/26/2022 88    AST 12/26/2022 21    ALT 12/26/2022 31    Total Protein 12/26/2022 7.6    Albumin 12/26/2022 4.4    GFR 12/26/2022 111.92    Calcium 12/26/2022 9.2    WBC 12/26/2022 6.0    RBC 12/26/2022 5.63    Hemoglobin 12/26/2022 16.3    HCT 12/26/2022 48.5    MCV 12/26/2022 86.2    MCHC 12/26/2022 33.7    RDW 12/26/2022 13.5    Platelets 12/26/2022 367.0    Neutrophils Relative % 12/26/2022 66.0    Lymphocytes Relative 12/26/2022 25.6    Monocytes Relative 12/26/2022 6.8    Eosinophils Relative 12/26/2022 1.2    Basophils Relative 12/26/2022 0.4    Neutro Abs 12/26/2022 4.0    Lymphs Abs 12/26/2022 1.5    Monocytes Absolute 12/26/2022 0.4    Eosinophils Absolute 12/26/2022 0.1    Basophils Absolute 12/26/2022 0.0    TSH 12/26/2022 0.718    Hgb A1c MFr Bld 12/26/2022 5.8    VITD 12/26/2022 12.85 (L)    Vitamin B-12 12/26/2022 206 (L)   No image results found. CT CARDIAC SCORING (SELF PAY ONLY) Addendum Date: 01/03/2023 ADDENDUM REPORT: 01/03/2023 13:25 EXAM: OVER-READ INTERPRETATION  CT CHEST The following report is an over-read performed by radiologist Dr. Royal Piedra [facility] on 01/03/2023. This over-read does not include interpretation of cardiac or coronary anatomy or pathology. The coronary calcium score interpretation by the cardiologist is attached. COMPARISON:  None available. FINDINGS: Multiple small pulmonary nodules are noted in the lungs, largest of which is in the anterior aspect of the right lower lobe (axial image 12 of series 4) measuring 6 x 4 mm (mean diameter 5 mm). Within the visualized portions of the thorax there are no suspicious appearing pulmonary nodules or masses, there is no acute consolidative airspace disease, no pleural effusions, no pneumothorax and no lymphadenopathy. Visualized portions of the upper abdomen are  unremarkable. There are no aggressive appearing lytic or blastic lesions noted in the visualized portions of the skeleton. IMPRESSION: 1. Multiple pulmonary nodules measuring 5 mm or less in size, nonspecific, but statistically likely benign. No follow-up needed if patient is low-risk (and has no known or suspected primary neoplasm). Non-contrast chest CT can be considered in 12 months if patient is high-risk. This recommendation follows the consensus statement: Guidelines for Management of Incidental Pulmonary Nodules Detected on CT Images: From the Fleischner Society 2017; Radiology 2017; 284:228-243. Electronically Signed   By: Trudie Reed M.D.   On: 01/03/2023 13:25   Result Date: 01/03/2023 CLINICAL DATA:  Cardiovascular Disease Risk stratification EXAM: Coronary Calcium Score TECHNIQUE: A gated, non-contrast computed tomography scan of the heart was performed using 3mm slice thickness. Axial images were analyzed on a dedicated workstation. Calcium scoring of the coronary arteries was performed using the Agatston method. FINDINGS: Coronary arteries: Normal origins. Coronary Calcium Score: Left main: 0 Left anterior descending artery: 0 Left circumflex artery: 0 Right coronary artery: 0 Total: 0 Percentile: 0 Pericardium: Normal. Aorta: Normal caliber of ascending aorta. No aortic atherosclerosis noted. Non-cardiac: See separate report from [facility]. IMPRESSION: Coronary calcium score of 0. This was 0 percentile for age-, race-, and sex-matched controls. RECOMMENDATIONS: Coronary artery calcium (CAC) score is a strong predictor of incident coronary heart disease (CHD) and provides predictive information beyond traditional risk factors. CAC scoring is reasonable to use in the decision to withhold, postpone, or initiate statin therapy in intermediate-risk or selected borderline-risk asymptomatic adults (age 65-75 years and LDL-C >=70 to <190 mg/dL) who do not have diabetes or established  atherosclerotic cardiovascular disease (ASCVD).* In intermediate-risk (10-year ASCVD risk >=7.5% to <20%) adults or selected borderline-risk (10-year ASCVD risk >=5% to <7.5%) adults in whom a CAC score is measured for the purpose of making a treatment decision the following recommendations have been made: If CAC=0, it is reasonable to withhold statin therapy and reassess in 5 to 10 years, as long as higher risk conditions are absent (diabetes mellitus, family history of premature CHD in first degree relatives (males <55 years; females <65 years), cigarette smoking, or LDL >=190 mg/dL). If CAC is 1 to 99, it is reasonable to initiate statin therapy for patients >=6 years of age. If CAC is >=100 or >=75th percentile, it is reasonable to initiate statin therapy at any age. Cardiology referral should be considered for patients with CAC scores >=400 or >=75th percentile. *2018 AHA/ACC/AACVPR/AAPA/ABC/ACPM/ADA/AGS/APhA/ASPC/NLA/PCNA Guideline on the Management of Blood Cholesterol: A Report of the American College of Cardiology/American Heart Association Task Force on Clinical Practice Guidelines. J Am Coll Cardiol. 2019;73(24):3168-3209. Jodelle Red, MD Electronically Signed: By: Jodelle Red M.D. On: 01/01/2023 16:45  CT CARDIAC SCORING (SELF PAY ONLY) Addendum Date: 01/03/2023 ADDENDUM REPORT:  01/03/2023 13:25 EXAM: OVER-READ INTERPRETATION  CT CHEST The following report is an over-read performed by radiologist Dr. Royal Piedra [facility] on 01/03/2023. This over-read does not include interpretation of cardiac or coronary anatomy or pathology. The coronary calcium score interpretation by the cardiologist is attached. COMPARISON:  None available. FINDINGS: Multiple small pulmonary nodules are noted in the lungs, largest of which is in the anterior aspect of the right lower lobe (axial image 12 of series 4) measuring 6 x 4 mm (mean diameter 5 mm). Within the visualized portions  of the thorax there are no suspicious appearing pulmonary nodules or masses, there is no acute consolidative airspace disease, no pleural effusions, no pneumothorax and no lymphadenopathy. Visualized portions of the upper abdomen are unremarkable. There are no aggressive appearing lytic or blastic lesions noted in the visualized portions of the skeleton. IMPRESSION: 1. Multiple pulmonary nodules measuring 5 mm or less in size, nonspecific, but statistically likely benign. No follow-up needed if patient is low-risk (and has no known or suspected primary neoplasm). Non-contrast chest CT can be considered in 12 months if patient is high-risk. This recommendation follows the consensus statement: Guidelines for Management of Incidental Pulmonary Nodules Detected on CT Images: From the Fleischner Society 2017; Radiology 2017; 284:228-243. Electronically Signed   By: Trudie Reed M.D.   On: 01/03/2023 13:25   Result Date: 01/03/2023 CLINICAL DATA:  Cardiovascular Disease Risk stratification EXAM: Coronary Calcium Score TECHNIQUE: A gated, non-contrast computed tomography scan of the heart was performed using 3mm slice thickness. Axial images were analyzed on a dedicated workstation. Calcium scoring of the coronary arteries was performed using the Agatston method. FINDINGS: Coronary arteries: Normal origins. Coronary Calcium Score: Left main: 0 Left anterior descending artery: 0 Left circumflex artery: 0 Right coronary artery: 0 Total: 0 Percentile: 0 Pericardium: Normal. Aorta: Normal caliber of ascending aorta. No aortic atherosclerosis noted. Non-cardiac: See separate report from [facility]. IMPRESSION: Coronary calcium score of 0. This was 0 percentile for age-, race-, and sex-matched controls. RECOMMENDATIONS: Coronary artery calcium (CAC) score is a strong predictor of incident coronary heart disease (CHD) and provides predictive information beyond traditional risk factors. CAC scoring is reasonable  to use in the decision to withhold, postpone, or initiate statin therapy in intermediate-risk or selected borderline-risk asymptomatic adults (age 76-75 years and LDL-C >=70 to <190 mg/dL) who do not have diabetes or established atherosclerotic cardiovascular disease (ASCVD).* In intermediate-risk (10-year ASCVD risk >=7.5% to <20%) adults or selected borderline-risk (10-year ASCVD risk >=5% to <7.5%) adults in whom a CAC score is measured for the purpose of making a treatment decision the following recommendations have been made: If CAC=0, it is reasonable to withhold statin therapy and reassess in 5 to 10 years, as long as higher risk conditions are absent (diabetes mellitus, family history of premature CHD in first degree relatives (males <55 years; females <65 years), cigarette smoking, or LDL >=190 mg/dL). If CAC is 1 to 99, it is reasonable to initiate statin therapy for patients >=29 years of age. If CAC is >=100 or >=75th percentile, it is reasonable to initiate statin therapy at any age. Cardiology referral should be considered for patients with CAC scores >=400 or >=75th percentile. *2018 AHA/ACC/AACVPR/AAPA/ABC/ACPM/ADA/AGS/APhA/ASPC/NLA/PCNA Guideline on the Management of Blood Cholesterol: A Report of the American College of Cardiology/American Heart Association Task Force on Clinical Practice Guidelines. J Am Coll Cardiol. 2019;73(24):3168-3209. Jodelle Red, MD Electronically Signed: By: Jodelle Red M.D. On: 01/01/2023 16:45      Procedure:  PROCEDURE NOTE: CRYOTHERAPY  Date: 01/08/2023  PROCEDURE: Cryotherapy for multiple skin tags  LOCATION: Neck and bilateral axillary regions  NUMBER OF LESIONS: 15 total lesions  DESCRIPTION OF PROCEDURE: After obtaining informed consent, multiple skin tags were identified and treated with cryotherapy. Each lesion underwent 5 freeze-thaw cycles. The largest lesion received additional treatment cycles to ensure adequate depth of  freeze. Patient tolerated the procedure well with expected discomfort.  INFORMED CONSENT: Risks discussed including: - Post-procedure pain and swelling - Skin color changes - Potential scarring - Risk of infection if area traumatized Benefits discussed: - Removal of skin tags - Minimally invasive procedure Patient demonstrated understanding and agreed to proceed.  ANESTHESIA: None required  COMPLICATIONS: None  ESTIMATED BLOOD LOSS: None  POST-PROCEDURE CONDITION: Stable - Expected erythema and mild edema at treatment sites - No bleeding - No immediate complications  POST-PROCEDURE INSTRUCTIONS PROVIDED: 1. Avoid picking or traumatizing treated areas 2. Use ibuprofen for discomfort 3. Apply OTC lidocaine ointment as needed 4. Keep areas clean and dry 5. Return in 3-4 weeks for evaluation of any residual lesions  DISPOSITION: Patient discharged in stable condition  FOLLOW-UP: 3-4 weeks   Assessment & Plan Metabolic syndrome Complex presentation with prediabetes (A1c 5.8), dyslipidemia, and central obesity Comprehensive lifestyle modification program initiated:  Low-carbohydrate diet with focus on protein sources compatible with vegetarian diet Resistance training with resistance bands for muscle building Elimination of sugary beverages and refined carbohydrates Will reassess metabolic parameters in 3 months Patient education provided regarding metabolic syndrome management Tachycardia Coronary calcium score 0 indicates very low cardiovascular risk Tachycardia likely due to deconditioning rather than cardiac pathology Plan:  Begin graduated exercise program Monitor heart rate during exercise No cardiology referral needed given benign findings Incidental pulmonary nodule, > 3mm and < 8mm  Other hyperlipidemia Total cholesterol 222, Triglycerides 178, LDL 135 Currently using red yeast rice extract Agreed to trial of statin therapy given overall cardiovascular  risk profile Will monitor for side effects and tolerance Skin tags, multiple acquired Procedure performed: Cryotherapy to multiple skin tags Total of 15 lesions treated with freeze-thaw cycles Post-procedure care instructions provided:  Avoid picking or traumatizing treated areas Use ibuprofen and topical lidocaine as needed Monitor for healing over next 2 weeks Return in 3-4 weeks for any residual lesions Vitamin deficiency Vitamin D 12.85 (Low) B12 206 (Low) Continue supplementation:  Vitamin D 2000 IU daily B12 supplementation Recheck levels in 3 months     Orders Placed During this Encounter:   Meds ordered this encounter  Medications   rosuvastatin (CRESTOR) 10 MG tablet    Sig: Take 1 tablet (10 mg total) by mouth daily.    Dispense:  90 tablet    Refill:  3   FOLLOW-UP: Return in 3-4 weeks for skin tag check. Schedule follow-up CT in one year for nodule surveillance. Consider metabolic panel and lipid profile in 3 months if financially feasible.      MEDICAL DECISION MAKING ATTESTATION  Level of Medical Decision Making: Moderate to High Complexity (Level 4)  1. Number and Complexity of Problems Addressed: - Multiple chronic conditions requiring medication therapy:   * Metabolic syndrome with prediabetes (A1c 5.8)   * Hyperlipidemia requiring medication initiation   * Exercise-induced tachycardia with diagnostic evaluation   * Vitamin deficiencies requiring supplementation - Stable chronic conditions: Pulmonary nodules requiring monitoring  2. Amount and/or Complexity of Data Reviewed: - Review and analysis of diagnostic testing:   * CT calcium score results   * Interpretation  of pulmonary nodule findings   * Review of comprehensive metabolic labs   * Analysis of lipid panel results - Assessment of interval changes in:   * Vital signs and weight trends   * Metabolic parameters   * Cardiovascular risk factors  3. Risk of Complications and/or  Morbidity: Moderate risk management decisions including: - Prescription drug management (initiation of statin therapy) - Management of multiple stable chronic conditions - Performance of minor procedure (cryotherapy) - Dietary and lifestyle modifications with structured monitoring plan  Medical necessity for Level 4 service is supported by: - Multiple chronic conditions requiring ongoing management - Diagnostic data requiring interpretation and synthesis - Prescription medication management - Minor procedure performance - Moderate complexity medical decision making regarding cardiovascular risk  This document was synthesized by artificial intelligence (Abridge) using HIPAA-compliant recording of the clinical interaction;   We discussed the use of AI scribe software for clinical note transcription with the patient, who gave verbal consent to proceed.    Additional Info: This encounter employed state-of-the-art, real-time, collaborative documentation. The patient actively reviewed and assisted in updating their electronic medical record on a shared screen, ensuring transparency and facilitating joint problem-solving for the problem list, overview, and plan. This approach promotes accurate, informed care. The treatment plan was discussed and reviewed in detail, including medication safety, potential side effects, and all patient questions. We confirmed understanding and comfort with the plan. Follow-up instructions were established, including contacting the office for any concerns, returning if symptoms worsen, persist, or new symptoms develop, and precautions for potential emergency department visits.

## 2023-01-08 NOTE — Telephone Encounter (Signed)
Per patient has not been able to pick up   Blood Glucose Test Strips . patient requesting we contact pharmacy to see what's going on . Call back number is [phone]

## 2023-01-09 NOTE — Telephone Encounter (Signed)
Called pharmacy, they confirmed that patient picked the test strips up yesterday.

## 2023-01-11 ENCOUNTER — Encounter: Payer: Self-pay | Admitting: Internal Medicine

## 2023-01-11 NOTE — Telephone Encounter (Signed)
Patient's review of lab results/notes confirmed. Sent my chart message reminding patient to schedule a follow-up visit.

## 2023-12-31 ENCOUNTER — Encounter: Admitting: Internal Medicine

## 2023-12-31 ENCOUNTER — Ambulatory Visit: Payer: Self-pay | Admitting: Internal Medicine

## 2023-12-31 VITALS — BP 120/80 | HR 69 | Temp 98.0°F | Ht 64.0 in | Wt 166.4 lb

## 2023-12-31 DIAGNOSIS — R7303 Prediabetes: Secondary | ICD-10-CM | POA: Diagnosis not present

## 2023-12-31 DIAGNOSIS — Z789 Other specified health status: Secondary | ICD-10-CM

## 2023-12-31 DIAGNOSIS — E7849 Other hyperlipidemia: Secondary | ICD-10-CM | POA: Diagnosis not present

## 2023-12-31 DIAGNOSIS — Z23 Encounter for immunization: Secondary | ICD-10-CM

## 2023-12-31 DIAGNOSIS — Z0000 Encounter for general adult medical examination without abnormal findings: Secondary | ICD-10-CM | POA: Diagnosis not present

## 2023-12-31 DIAGNOSIS — Z0001 Encounter for general adult medical examination with abnormal findings: Secondary | ICD-10-CM | POA: Diagnosis not present

## 2023-12-31 DIAGNOSIS — E538 Deficiency of other specified B group vitamins: Secondary | ICD-10-CM

## 2023-12-31 DIAGNOSIS — E559 Vitamin D deficiency, unspecified: Secondary | ICD-10-CM

## 2023-12-31 LAB — CBC WITH DIFFERENTIAL/PLATELET
Basophils Absolute: 0 K/uL (ref 0.0–0.1)
Basophils Relative: 0.6 % (ref 0.0–3.0)
Eosinophils Absolute: 0.1 K/uL (ref 0.0–0.7)
Eosinophils Relative: 1.4 % (ref 0.0–5.0)
HCT: 45.9 % (ref 39.0–52.0)
Hemoglobin: 15.6 g/dL (ref 13.0–17.0)
Lymphocytes Relative: 26.7 % (ref 12.0–46.0)
Lymphs Abs: 1.7 K/uL (ref 0.7–4.0)
MCHC: 33.9 g/dL (ref 30.0–36.0)
MCV: 84.6 fl (ref 78.0–100.0)
Monocytes Absolute: 0.4 K/uL (ref 0.1–1.0)
Monocytes Relative: 6.8 % (ref 3.0–12.0)
Neutro Abs: 4 K/uL (ref 1.4–7.7)
Neutrophils Relative %: 64.5 % (ref 43.0–77.0)
Platelets: 293 K/uL (ref 150.0–400.0)
RBC: 5.42 Mil/uL (ref 4.22–5.81)
RDW: 13.1 % (ref 11.5–15.5)
WBC: 6.2 K/uL (ref 4.0–10.5)

## 2023-12-31 LAB — LIPID PANEL
Cholesterol: 221 mg/dL — ABNORMAL HIGH (ref 28–200)
HDL: 45.8 mg/dL
LDL Cholesterol: 143 mg/dL — ABNORMAL HIGH (ref 10–99)
NonHDL: 175.48
Total CHOL/HDL Ratio: 5
Triglycerides: 163 mg/dL — ABNORMAL HIGH (ref 10.0–149.0)
VLDL: 32.6 mg/dL (ref 0.0–40.0)

## 2023-12-31 LAB — COMPREHENSIVE METABOLIC PANEL WITH GFR
ALT: 21 U/L (ref 3–53)
AST: 19 U/L (ref 5–37)
Albumin: 4.5 g/dL (ref 3.5–5.2)
Alkaline Phosphatase: 78 U/L (ref 39–117)
BUN: 8 mg/dL (ref 6–23)
CO2: 29 meq/L (ref 19–32)
Calcium: 9.4 mg/dL (ref 8.4–10.5)
Chloride: 99 meq/L (ref 96–112)
Creatinine, Ser: 0.7 mg/dL (ref 0.40–1.50)
GFR: 114.37 mL/min
Glucose, Bld: 89 mg/dL (ref 70–99)
Potassium: 4.4 meq/L (ref 3.5–5.1)
Sodium: 135 meq/L (ref 135–145)
Total Bilirubin: 0.5 mg/dL (ref 0.2–1.2)
Total Protein: 7.6 g/dL (ref 6.0–8.3)

## 2023-12-31 LAB — HEMOGLOBIN A1C: Hgb A1c MFr Bld: 5.6 % (ref 4.6–6.5)

## 2023-12-31 LAB — VITAMIN D 25 HYDROXY (VIT D DEFICIENCY, FRACTURES): VITD: 19.34 ng/mL — ABNORMAL LOW (ref 30.00–100.00)

## 2023-12-31 LAB — FERRITIN: Ferritin: 29.7 ng/mL (ref 22.0–322.0)

## 2023-12-31 LAB — B12 AND FOLATE PANEL
Folate: 14.8 ng/mL
Vitamin B-12: 621 pg/mL (ref 211–911)

## 2023-12-31 NOTE — Assessment & Plan Note (Signed)
 Vitamin D  levels need monitoring as part of routine health maintenance. Ordered vitamin D  level test.

## 2023-12-31 NOTE — Progress Notes (Signed)
 " [facility] [address] [place], [place] [address] Office:  [phone]  -- Annual Preventive Medical Office Visit --  Patient:  Dylan Vazquez      Age: 43 y.o.       Sex:  male  Date:   12/31/2023 Patient Care Team: Jesus Bernardino MATSU, MD as PCP - General (Internal Medicine) Today's Healthcare Provider: Bernardino MATSU Jesus, MD  ========================================= Chief complaint: Annual Exam (Pt is present for CPE )  Purpose of Visit: Comprehensive preventive health assessment and personalized health maintenance planning.  This encounter was conducted as a Comprehensive Physical Exam (CPE) preventive care annual visit. The patient's medical history and problem list were reviewed to inform individualized preventive care recommendations.   No problem-specific medical treatment was provided during this visit.  Assessment & Plan Encounter for annual general medical examination with abnormal findings in adult Encounter for annual health examination Comprehensive Preventive Examination (Annual Wellness Visit) Performed Today.  Abridge Summary:  Discussed the importance of vaccinations and screenings. Gardasil vaccine recommended to prevent esophageal cancer and other HPV-related cancers. Hepatitis B vaccine recommended due to risk with needle sticks. Flu vaccine recommended for annual protection. Discussed benefits of resistance training and dietary modifications for weight management and visceral fat reduction. Administered Gardasil and flu vaccines today. Schedule follow-up for Gardasil booster in one month and six months. Encouraged resistance training and dietary modifications for weight management. ?? Core Review: Interval HPI/ROS reviewed. History, current Medications (including reconciliation), known Allergies, and Immunization Status were thoroughly reviewed and updated. ?? Risk Assessment: Comprehensive Family, Social (including  tobacco/alcohol/substance use), and Safety/Functional risks were assessed. Vitals and a complete, age-appropriate Physical Exam were fully documented. ? Preventive Planning: I reviewed all appropriate preventive screening tests (e.g., mammogram, colonoscopy, cervical cancer screening) and immunization needs (e.g., influenza, pneumococcal, Zoster). ??? Counseling & Education: Provided individualized counseling on Healthy Diet (e.g., DASH/Mediterranean), Regular Physical Activity (consistent with CDC guidelines), Sleep Hygiene, Stress Management strategies, and Mental Health screening results. ?? Conclusion: Shared decision-making was performed for all recommended interventions. Orders were placed for indicated screenings and/or vaccinations. Written educational materials were provided to reinforce today's counseling and plan. Vitamin B12 deficiency B12 levels are low, and insurance will cover testing due to previous abnormal results. Ordered B12 level test. Other hyperlipidemia Cholesterol levels are not optimal, but CT scan shows no heart damage. Risk of heart disease is low at 1-2%. Ordered cholesterol panel. Consider cholesterol medication if A1c increases or cholesterol worsens. Vitamin D  deficiency Vitamin D  levels need monitoring as part of routine health maintenance. Ordered vitamin D  level test. Vegetarian diet  Prediabetes A1c is 5.8, indicating prediabetes. No need for insulin resistance testing as insurance does not cover it and A1c will determine medication eligibility. Recheck A1c this year to monitor for progression to diabetes.     ICD-10-CM   1. Encounter for annual general medical examination with abnormal findings in adult  Z00.01 CBC with Differential/Platelet    Comprehensive metabolic panel with GFR    Lipid panel    Hemoglobin A1c    Vitamin D  (25 hydroxy)    B12 and Folate Panel    2. Encounter for annual health examination  Z00.00     3. Vitamin B12 deficiency  E53.8  B12 and Folate Panel    4. Other hyperlipidemia  E78.49 Lipid panel    5. Vitamin D  deficiency  E55.9 Vitamin D  (25 hydroxy)    6. Vegetarian diet  Z78.9 Ferritin  7. 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 and flu only for now to limit number of same day vaccine     Health Maintenance  Topic Date Due   Hepatitis B Vaccines 19-59 Average Risk (1 of 3 - 19+ 3-dose series) Never done   HPV VACCINES (1 - Risk 3-dose SCDM series) Never done   Influenza Vaccine  08/10/2023   COVID-19 Vaccine (3 - Mixed Product risk series) 01/16/2024 (Originally 10/03/2019)   DTaP/Tdap/Td (2 - Td or Tdap) 12/25/2032   Hepatitis C Screening  Completed   HIV Screening  Completed   Pneumococcal Vaccine  Aged Out   Meningococcal B Vaccine  Aged Out    Reviewed the following verbally with patient and provided AVS materials:   HEALTH MAINTENANCE COUNSELING AND ANTICIPATORY GUIDANCE    Preventive Measure Recommendation  Eye Exams Every 1-2 years  Dental Care Cleanings every 6 months or more, brush/floss 3x daily  Sinus Care Saline spray rinses daily  Sleep 8 hours nightly, good sleep hygiene, e-monitoring if any daytime drowsiness  Diet Fruits/vegetables/fiber/healthy fats, balance and moderation  Exercise 150 minutes weekly  Risk Behaviors Discouraged any/all high risk behaviors    CANCER SCREENING SHARED DECISION  MAKING    Thyroid Thyroid was palpated for nodules today.  Prostate Individualized risks/benefits/costs discussed  Patient is <45-50 and average risk; routine screening not indicated today. Education provided about future SDM timeline. No results found for: PSA  Colon HM Colonoscopy   This patient has no relevant Health Maintenance data.   Patient is <45 without additional risk factors (reports no bleeding or early family history or inflammatory bowel disease); no routine screening indicated today. Counseling provided on symptoms that warrant earlier evaluation. (Education given.)  Lung Current guidelines recommend individuals aged 70 to 76 who currently smoke or formerly smoked and have a >= 20 pack-year smoking history should undergo annual screening with low-dose computed tomography (LDCT). Tobacco Use: Unknown (12/31/2023)   Patient History    Smoking Tobacco Use: Never    Smokeless Tobacco Use: Unknown    Passive Exposure: Not on file  Tobacco Use History[1]  He specifically denied any new lumps/lesions anywhere.    Discussed the use of AI scribe software for clinical note transcription with the patient, who gave verbal consent to proceed.  History of Present Illness 43 year old male who presents for an annual physical exam and vaccination update.  He is actively engaged in a fitness regimen that includes strength training at home, stretching, and walking 10,000 steps daily. He has incorporated intermittent fasting into his routine, resulting in a weight loss of approximately 11 pounds. He is considering using suspension trainers to enhance his workout routine.  He has a history of an appendectomy performed in [place] approximately 30-35 years ago, during which a portion of the intestine may have been removed; the patient recalls this was related to an unclear issue, possibly an allergic reaction. This has resulted in some abnormal fat distribution  in the abdominal area.  He is  currently taking protein and fiber supplements as part of his diet and follows online diet and exercise programs. He follows a vegetarian diet but includes eggs in his meals, consuming at least three eggs per week. He monitors his vitamin levels due to his dietary choices.  He has a history of prediabetes with a previous A1c of 5.8 and is monitoring his condition to prevent progression to diabetes. He has not been on any diabetes medication.  He reports improved sleep quality and is planning to undergo several vaccinations, including the flu shot and Gardasil, with a pending hepatitis B vaccination. He has undergone a cardiac profile test in November 2025 for insurance purposes, which included blood pressure readings. He is seeking documentation of these readings for insurance premium purposes.  No family history of prostate cancer, GI bleeding, inflammatory bowel disease, or colon cancer. He reports occasional knee pain when climbing stairs.  ROS A comprehensive ROS was negative for any concerning symptoms.   Completed medication reconciliation: Current Outpatient Medications on File Prior to Visit  Medication Sig   UNABLE TO FIND Esomeprazole 40mg  + Levosulpiride 75mg  (from [place]).   Blood Glucose Monitoring Suppl (ONETOUCH VERIO REFLECT) w/Device KIT USE AS DIRECTED IN THE MORNING, AT NOON, AND AT BEDTIME (Patient not taking: Reported on 12/31/2023)   Blood Glucose Monitoring Suppl DEVI 1 each by Does not apply route in the morning, at noon, and at bedtime. May substitute to any manufacturer covered by patient's insurance. (Patient not taking: Reported on 12/31/2023)   ciclopirox  (LOPROX ) 0.77 % cream Apply topically 2 (two) times daily. (Patient not taking: Reported on 12/31/2023)   fluticasone  (FLONASE ) 50 MCG/ACT nasal spray Place 2 sprays into both nostrils daily. (Patient not taking: Reported on 12/31/2023)   rosuvastatin  (CRESTOR ) 10 MG tablet Take 1 tablet (10 mg total) by mouth daily.  (Patient not taking: Reported on 12/31/2023)   SUMAtriptan  (IMITREX ) 50 MG tablet Take 1 tablet (50 mg total) by mouth daily as needed for migraine. May repeat in 2 hours if headache persists or recurs. (Patient not taking: Reported on 12/31/2023)   No current facility-administered medications on file prior to visit.  There are no discontinued medications.The following were reviewed and/or entered/updated into our electronic MEDICAL RECORD NUMBERPast Medical History:  Diagnosis Date   Encounter for annual physical exam 12/08/2021   GERD (gastroesophageal reflux disease) 01/10/2007   Hyperlipidemia 12/26/2022   Ulcer 01/10/2007   Acidity issue on esophageal   Past Surgical History:  Procedure Laterality Date   APPENDECTOMY     COSMETIC SURGERY     Gynacomastia   VASECTOMY     Social History   Socioeconomic History   Marital status: Married    Spouse name: Not on file   Number of children: Not on file   Years of education: Not on file   Highest education level: Bachelor's degree (e.g., BA, AB, BS)  Occupational History   Not on file  Tobacco Use   Smoking status: Never   Smokeless tobacco: Not on file  Substance and Sexual Activity   Alcohol use: Never   Drug use: Never   Sexual activity: Yes    Birth control/protection: Surgical    Comment: vasectomy  Other Topics Concern   Not on file  Social History Narrative   Not on file   Social Drivers of Health   Tobacco Use: Unknown (12/31/2023)   Patient History    Smoking Tobacco Use: Never  Smokeless Tobacco Use: Unknown    Passive Exposure: Not on file  Financial Resource Strain: Low Risk (12/31/2023)   Overall Financial Resource Strain (CARDIA)    Difficulty of Paying Living Expenses: Not hard at all  Food Insecurity: No Food Insecurity (12/31/2023)   Epic    Worried About Programme Researcher, Broadcasting/film/video in the Last Year: Never true    Ran Out of Food in the Last Year: Never true  Transportation Needs: No Transportation Needs  (12/31/2023)   Epic    Lack of Transportation (Medical): No    Lack of Transportation (Non-Medical): No  Physical Activity: Sufficiently Active (12/31/2023)   Exercise Vital Sign    Days of Exercise per Week: 5 days    Minutes of Exercise per Session: 60 min  Stress: No Stress Concern Present (12/31/2023)   [employer] of Occupational Health - Occupational Stress Questionnaire    Feeling of Stress: Not at all  Social Connections: Moderately Integrated (12/31/2023)   Social Connection and Isolation Panel    Frequency of Communication with Friends and Family: More than three times a week    Frequency of Social Gatherings with Friends and Family: Three times a week    Attends Religious Services: More than 4 times per year    Active Member of Clubs or Organizations: No    Attends Banker Meetings: Not on file    Marital Status: Married  Catering Manager Violence: Not on file  Depression (PHQ2-9): Low Risk (12/31/2023)   Depression (PHQ2-9)    PHQ-2 Score: 0  Alcohol Screen: Not on file  Housing: Low Risk (12/31/2023)   Epic    Unable to Pay for Housing in the Last Year: No    Number of Times Moved in the Last Year: 0    Homeless in the Last Year: No  Utilities: Not on file  Health Literacy: Not on file      12/31/2023   10:13 AM  Alcohol Use Disorder Test (AUDIT)  1. How often do you have a drink containing alcohol? 0   3. How often do you have six or more drinks on one occasion? 0      Manually entered by patient   Family History  Problem Relation Age of Onset   Hypertension Mother    Diabetes Father    Arthritis Father    Asthma Son   Allergies[2] Social History   Substance and Sexual Activity  Sexual Activity Yes   Birth control/protection: Surgical   Comment: vasectomy  @    12/31/2023    1:19 PM  Depression screen PHQ 2/9  Decreased Interest 0  Down, Depressed, Hopeless 0  PHQ - 2 Score 0      01/08/2023   10:29 AM  Fall Risk    Falls in the past year? 0  Number falls in past yr: 0  Injury with Fall? 0   Risk for fall due to : No Fall Risks  Follow up Falls evaluation completed     Data saved with a previous flowsheet row definition     BP 120/80   Pulse 69   Temp 98 F (36.7 C) (Temporal)   Ht 5' 4 (1.626 m)   Wt 166 lb 6.4 oz (75.5 kg)   SpO2 98%   BMI 28.56 kg/m  BP Readings from Last 3 Encounters:  12/31/23 120/80  01/08/23 122/88  12/26/22 129/87   Wt Readings from Last 10 Encounters:  12/31/23 166 lb 6.4 oz (75.5  kg)  01/08/23 178 lb (80.7 kg)  12/26/22 173 lb 9.6 oz (78.7 kg)  03/27/22 172 lb (78 kg)  03/14/22 170 lb 12.8 oz (77.5 kg)  12/08/21 171 lb (77.6 kg)  01/14/21 175 lb (79.4 kg)  11/26/20 179 lb (81.2 kg)  12/06/19 176 lb 5.9 oz (80 kg)  Physical Exam  Physical Exam VITALS: BP- 123/86 MEASUREMENTS: Weight- 166. HEENT: Ears clean, no cerumen. Pharynx normal. NECK: Neck supple, no nuchal rigidity. Thyroid no thyromegaly. No cervical adenopathy. CHEST: Lungs clear to auscultation bilaterally. ABDOMEN: Abdomen with surgical scar, abnormal fat distribution.  GEN: No acute distress, resting comfortably. HEENT: Tympanic membranes normal appearing bilaterally, oropharynx clear, no thyromegaly noted, no palpable lymphadenopathy or thyroid nodules. CARDIOVASCULAR: S1 and S2 heart sounds with regular rate and rhythm, no murmurs appreciated. PULMONARY: Normal work of breathing, clear to auscultation bilaterally, no crackles, wheezes, or rhonchi. ABDOMEN: Soft, nontender, nondistended. MSK: No edema, cyanosis, or clubbing noted. SKIN: Warm, dry, no lesions of concern observed. NEUROLOGICAL: Cranial nerves II-XII grossly intact, strength 5/5 in upper and lower extremities, reflexes symmetric and intact bilaterally. PSYCH: Normal affect and thought content, pleasant and cooperative.  Last CBC Lab Results  Component Value Date   WBC 6.0 12/26/2022   HGB 16.3 12/26/2022   HCT 48.5  12/26/2022   MCV 86.2 12/26/2022   RDW 13.5 12/26/2022   PLT 367.0 12/26/2022   Last metabolic panel Lab Results  Component Value Date   GLUCOSE 86 12/26/2022   NA 137 12/26/2022   K 4.3 12/26/2022   CL 101 12/26/2022   CO2 30 12/26/2022   BUN 8 12/26/2022   CREATININE 0.77 12/26/2022   GFR 111.92 12/26/2022   CALCIUM  9.2 12/26/2022   PROT 7.6 12/26/2022   ALBUMIN 4.4 12/26/2022   BILITOT 0.4 12/26/2022   ALKPHOS 88 12/26/2022   AST 21 12/26/2022   ALT 31 12/26/2022   Last lipids Lab Results  Component Value Date   CHOL 222 (H) 12/26/2022   HDL 50.60 12/26/2022   LDLCALC 135 (H) 12/26/2022   TRIG 178.0 (H) 12/26/2022   CHOLHDL 4 12/26/2022  The 10-year ASCVD risk score (Arnett DK, et al., 2019) is: 1.4%   Values used to calculate the score:     Age: 4 years     Clinically relevant sex: Male     Is Non-Hispanic African American: No     Diabetic: No     Tobacco smoker: No     Systolic Blood Pressure: 120 mmHg     Is BP treated: No     HDL Cholesterol: 50.6 mg/dL     Total Cholesterol: 222 mg/dL    Last hemoglobin J8r Lab Results  Component Value Date   HGBA1C 5.8 12/26/2022   Last thyroid functions Lab Results  Component Value Date   TSH 0.718 12/26/2022   Last vitamin D  Lab Results  Component Value Date   VD25OH 12.85 (L) 12/26/2022   Last vitamin B12 and Folate Lab Results  Component Value Date   VITAMINB12 206 (L) 12/26/2022        ======================================  IMPORTANT HEALTH REMINDERS: Report any new or changing skin lesions promptly Maintain recommended screening schedules Discuss any new family history of cancer at future visits Follow up on any new symptoms that persist more than two weeks      Notes:  This document was synthesized by artificial intelligence (Abridge) using HIPAA-compliant recording of the clinical interaction;   We discussed the use of AI scribe software  for clinical note transcription with the  patient, who gave verbal consent to proceed.    This encounter employed state-of-the-art, real-time, collaborative documentation. The patient was empowered to actively review and assist in updating their electronic medical record on a shared monitor, ensuring transparency and improving accuracy.    Prior to and at the beginning of Comprehensive Physical Exam (CPE) preventive care annual visit appointment types  we clarify to patients Our goal today is to focus on your preventive or annual Comprehensive Physical Exam (CPE) preventive care annual visit, which typically covers routine screenings and overall health maintenance. However, if you share any new or concerning symptoms--such as dizziness, passing out, severe pain, or anything else that may point to a more serious issue--we are both legally and ethically required to evaluate it. We cannot simply overlook or ignore such concerns, even if you later decide you dont want to discuss them, because it could jeopardize your health.  If addressing a new concern takes us  beyond the scope of the preventive visit, we may need to bill separately for that portion of care. We understand financial considerations are important, and were happy to discuss your options if something new comes up. However, we want to be clear that once you mention a potentially serious issue, we must investigate it; we cant ethically or legally exclude that from our records or our evaluation. Please let us  know all of your questions or worries. Together, we can decide how best to manage them and how to minimize any unexpected costs, but we want to keep you safe above all else.   This disclosure is mandated by professional ethics and legal obligations, as healthcare providers must address any substantial health concerns raised during any patient interaction and a comprehensive ROS is required by insurance companies for billing preventive-care visit type.   This disclosure ultimately  discourages patients financially from reporting significant health issues.   Medical Screening Exam A medical screening exam (MSE) helps to determine whether you need immediate medical treatment relating to any number of symptoms you are having. This type of exam may be done in an emergency department, an urgent care setting, or your health care provider's office. Depending on your symptoms and severity, you may need additional tests or medical therapy. It is important to note that an MSE does not necessarily mean that you will need or receive further medical testing or interventions if your symptoms are not deemed to be medically urgent (emergent). Tell a health care provider about: Any allergies you have. All medicines you are taking, including vitamins, herbs, eye drops, creams, and over-the-counter medicines. Any problems you or family members have had with anesthetic medicines. Any bleeding problems you have. Any surgeries you have had. Any medical conditions you have. Whether you are pregnant or may be pregnant. What happens during the test? During the exam, a health care provider does a short, often focused, physical exam and asks about your medical history to assess: Your current symptoms. Your overall health. Your need for possible further medical intervention. What can I expect after the test? If you have a regular health care provider, make an appointment for a follow-up visit with him or her. If you do not have a regular health care provider, ask about resources in your community. Your medical screening exam may determine that: You do not need emergency treatment at this time. You need treatment right away. You need to be transferred to another medical center. This may happen if you need an  emergent specialist or consultant that is not available at the medical center you are at. You need to have more tests. A medical specialist may be consulted if needed. Get help right away  if: Your condition gets worse. You develop new or troubling symptoms before you see your health care provider. These symptoms may represent a serious problem that is an emergency. Do not wait to see if the symptoms will go away. Get medical help right away. Call your local emergency services (911 in the [place] Do not drive yourself to the hospital. Summary A medical screening exam helps to determine whether you need medical treatment right away. This type of exam may be done in an emergency department, an urgent care setting, or your health care provider's office. During the exam, a health care provider does a short physical exam and asks about your current symptoms and overall health. Depending on the exam, more tests or therapies may be ordered. However, an MSE does not necessarily mean that you will have further medical testing if your symptoms are not deemed to be urgent. If you need further care that is not offered at your current medical center, you may need to be transferred to another facility. This information is not intended to replace advice given to you by your health care provider. Make sure you discuss any questions you have with your health care provider. Document Revised: 09/08/2020 Document Reviewed: 05/06/2020 Elsevier Patient Education  2024 [employer] Inc[employer]   Health Maintenance, Male Adopting a healthy lifestyle and getting preventive care are important in promoting health and wellness. Ask your health care provider about: The right schedule for you to have regular tests and exams. Things you can do on your own to prevent diseases and keep yourself healthy. What should I know about diet, weight, and exercise? Eat a healthy diet  Eat a diet that includes plenty of vegetables, fruits, low-fat dairy products, and lean protein. Do not eat a lot of foods that are high in solid fats, added sugars, or sodium. Maintain a healthy weight Body mass index (BMI) is a measurement that can  be used to identify possible weight problems. It estimates body fat based on height and weight. Your health care provider can help determine your BMI and help you achieve or maintain a healthy weight. Get regular exercise Get regular exercise. This is one of the most important things you can do for your health. Most adults should: Exercise for at least 150 minutes each week. The exercise should increase your heart rate and make you sweat (moderate-intensity exercise). Do strengthening exercises at least twice a week. This is in addition to the moderate-intensity exercise. Spend less time sitting. Even light physical activity can be beneficial. Watch cholesterol and blood lipids Have your blood tested for lipids and cholesterol at 41 years of age, then have this test every 5 years. You may need to have your cholesterol levels checked more often if: Your lipid or cholesterol levels are high. You are older than 41 years of age. You are at high risk for heart disease. What should I know about cancer screening? Many types of cancers can be detected early and may often be prevented. Depending on your health history and family history, you may need to have cancer screening at various ages. This may include screening for: Colorectal cancer. Prostate cancer. Skin cancer. Lung cancer. What should I know about heart disease, diabetes, and high blood pressure? Blood pressure and heart disease High blood pressure causes heart  disease and increases the risk of stroke. This is more likely to develop in people who have high blood pressure readings or are overweight. Talk with your health care provider about your target blood pressure readings. Have your blood pressure checked: Every 3-5 years if you are 63-25 years of age. Every year if you are 12 years old or older. If you are between the ages of 53 and 15 and are a current or former smoker, ask your health care provider if you should have a one-time  screening for abdominal aortic aneurysm (AAA). Diabetes Have regular diabetes screenings. This checks your fasting blood sugar level. Have the screening done: Once every three years after age 4 if you are at a normal weight and have a low risk for diabetes. More often and at a younger age if you are overweight or have a high risk for diabetes. What should I know about preventing infection? Hepatitis B If you have a higher risk for hepatitis B, you should be screened for this virus. Talk with your health care provider to find out if you are at risk for hepatitis B infection. Hepatitis C Blood testing is recommended for: Everyone born from [birth year] through 1963. Anyone with known risk factors for hepatitis C. Sexually transmitted infections (STIs) You should be screened each year for STIs, including gonorrhea and chlamydia, if: You are sexually active and are younger than 41 years of age. You are older than 41 years of age and your health care provider tells you that you are at risk for this type of infection. Your sexual activity has changed since you were last screened, and you are at increased risk for chlamydia or gonorrhea. Ask your health care provider if you are at risk. Ask your health care provider about whether you are at high risk for HIV. Your health care provider may recommend a prescription medicine to help prevent HIV infection. If you choose to take medicine to prevent HIV, you should first get tested for HIV. You should then be tested every 3 months for as long as you are taking the medicine. Follow these instructions at home: Alcohol use Do not drink alcohol if your health care provider tells you not to drink. If you drink alcohol: Limit how much you have to 0-2 drinks a day. Know how much alcohol is in your drink. In the [place] one drink equals one 12 oz bottle of beer (355 mL), one 5 oz glass of wine (148 mL), or one 1 oz glass of hard liquor (44 mL). Lifestyle Do not use any  products that contain nicotine or tobacco. These products include cigarettes, chewing tobacco, and vaping devices, such as e-cigarettes. If you need help quitting, ask your health care provider. Do not use street drugs. Do not share needles. Ask your health care provider for help if you need support or information about quitting drugs. General instructions Schedule regular health, dental, and eye exams. Stay current with your vaccines. Tell your health care provider if: You often feel depressed. You have ever been abused or do not feel safe at home. Summary Adopting a healthy lifestyle and getting preventive care are important in promoting health and wellness. Follow your health care provider's instructions about healthy diet, exercising, and getting tested or screened for diseases. Follow your health care provider's instructions on monitoring your cholesterol and blood pressure. This information is not intended to replace advice given to you by your health care provider. Make sure you discuss any questions you have  with your health care provider. Document Revised: 05/17/2020 Document Reviewed: 05/17/2020 Elsevier Patient Education  2024 Elsevier Inc.     [1]  Social History Tobacco Use  Smoking Status Never  Smokeless Tobacco Not on file  [2]  Allergies Allergen Reactions   Other Other (See Comments)   "

## 2023-12-31 NOTE — Assessment & Plan Note (Signed)
 B12 levels are low, and insurance will cover testing due to previous abnormal results. Ordered B12 level test.

## 2023-12-31 NOTE — Assessment & Plan Note (Signed)
 Cholesterol levels are not optimal, but CT scan shows no heart damage. Risk of heart disease is low at 1-2%. Ordered cholesterol panel. Consider cholesterol medication if A1c increases or cholesterol worsens.

## 2023-12-31 NOTE — Patient Instructions (Addendum)
 VISIT SUMMARY: Today, you came in for your annual physical exam and vaccination update. We discussed your fitness regimen, dietary habits, and recent weight loss. You also shared your medical history, including your appendectomy and prediabetes. We reviewed your current supplements and dietary choices, and you received the Gardasil and flu vaccines. We also discussed your cholesterol levels, vitamin deficiencies, and the importance of regular health screenings.  YOUR PLAN: -PREDIABETES: Prediabetes means your blood sugar levels are higher than normal but not high enough to be classified as diabetes. Your A1c is 5.8, which indicates prediabetes. We will recheck your A1c this year to monitor for any progression to diabetes.  -HYPERLIPIDEMIA: Hyperlipidemia means you have high levels of fats (lipids) in your blood, which can increase your risk of heart disease. Although your cholesterol levels are not optimal, your CT scan shows no heart damage, and your risk of heart disease is low. We have ordered a cholesterol panel and will consider medication if your A1c increases or your cholesterol worsens.  -VITAMIN B12 DEFICIENCY: Vitamin B12 deficiency means you have low levels of vitamin B12, which is important for nerve function and the production of red blood cells. We have ordered a B12 level test to monitor this deficiency.  -VITAMIN D  DEFICIENCY: Vitamin D  deficiency means you have low levels of vitamin D , which is important for bone health and immune function. We have ordered a vitamin D  level test as part of your routine health maintenance.  -GENERAL HEALTH MAINTENANCE: We discussed the importance of vaccinations and regular health screenings. You received the Gardasil and flu vaccines today. The Gardasil vaccine helps prevent HPV-related cancers, and the flu vaccine provides annual protection against the flu. We also talked about the benefits of resistance training and dietary modifications for weight  management and reducing visceral fat. Please schedule follow-ups for the Gardasil booster in one month and six months.  INSTRUCTIONS: Please schedule a follow-up appointment to recheck your A1c this year. Additionally, schedule follow-ups for the Gardasil booster in one month and six months. We have ordered tests for your cholesterol, vitamin B12, and vitamin D  levels. Continue with your current fitness regimen and dietary habits, and consider incorporating resistance training to enhance your workouts.    Building Your Long-Term Health Plan  During today's preventive visit, we covered a variety of important health checks to help you stay on top of your well-being.  We also discussed strategies to maintain your health and identified some areas that might benefit from further exploration.   Preventive care visits like today's are designed to be proactive, but sometimes additional attention may be needed.  Rest assured, we're here for you.  If these areas require further evaluation or management, we'd be happy to schedule a separate, focused appointment to address them in detail.  Addressing Next Steps  [x]   Follow-up Visit: To ensure we address any unresolved issues and continue monitoring your overall health, we recommend scheduling a follow-up appointment in 1 year for your next preventive care visit. If you experience any new problems, need to discuss any medical concerns, or your condition worsens before then, please don't hesitate to call our office to schedule an appointment or seek emergency care as needed.  [x]   Preventive Measures: Maintaining healthy habits plays a crucial role in overall wellness. We recommend considering these tips: [x]   Regular appointments with dental and vision professionals [x]   Nightly nasal saline mist to keep sinuses clear [x]   Consistent toothbrushing to maintain oral health [x]   Using  an app like SnoreLab to track sleep quality [x]   Routine checks of blood  pressure and heart rate [x]   Medical Information: In some instances, we may require additional medical information from other providers to create a comprehensive picture of your health. If applicable, we can provide a medical information release form at the front desk for you to sign, allowing us  to gather these records. [x]   Lab Tests: If any lab tests were ordered today, scheduling them within a week of your visit helps ensure the best possible insurance coverage.  Planning Follow Up to Work on a Problem? Make the Most of Our Focused (20 minute) Appointments  [x]   Clearly state your top concerns at the beginning of the visit to focus our discussion [x]   If you anticipate you will need more time, please inform the front desk during scheduling - we can book multiple appointments in the same week. [x]   If you have transportation problems- use our convenient video appointments or ask about transportation support. [x]   We can get down to business faster if you use MyChart to update information before the visit and submit non-urgent questions before your visit. Thank you for taking the time to provide details through MyChart.  Let our nurse know and she can import this information into your encounter documents.  Arrival and Wait Times  [x]   Arriving on time ensures that everyone receives prompt attention. [x]   Early morning (8a) and afternoon (1p) appointments tend to have shortest wait times. [x]   Unfortunately, we cannot delay appointments for late arrivals or hold slots during phone calls.  Bring to Your Next Appointment:  [x]   Medications: Please bring all your medication bottles to your next appointment to ensure we have an accurate record of your prescriptions. [x]   Health Diaries: If you're monitoring any health conditions at home, keeping a diary of your readings can be very helpful for discussions at your next appointment.  Reviewing Your Records  [x]   Review your attached preventive  care information at the end of these patient instructions. [x]   Review this early draft of your clinical encounter notes below and the final encounter summary tomorrow on MyChart after its been completed.      Getting Answers and Following Up  [x]   Simple Questions & Concerns: For quick questions or basic follow-up after your visit, reach us  at [phone] or MyChart messaging. [x]   Complex Concerns: If your concern is more complex, scheduling an appointment might be best. Discuss this with the staff to find the most suitable option. [x]   Lab & Imaging Results: We'll contact you directly if results are abnormal or you don't use MyChart. Most normal results will be on MyChart within 2-3 business days, with a review message from Dr. Jesus. Haven't heard back in 2 weeks? Need results sooner? Contact us  at [phone]. [x]   Referrals: Our referral coordinator will manage specialist referrals. The specialist's office should contact you within 2 weeks to schedule an appointment. Call us  if you haven't heard from them after 2 weeks.  Staying Connected  [x]   MyChart: Activate your MyChart for the fastest way to access results and message us . See the last page of this paperwork for instructions on how to activate.  Billing  [x]   X-ray & Lab Orders: These are billed by separate companies. Contact the invoicing company directly for questions or concerns. [x]   Visit Charges: Discuss any billing inquiries with our administrative services team.  Your Satisfaction Matters  [x]   Share  Your Experience: We strive for your satisfaction! If you have any complaints, or preferably compliments, please let Dr. Jesus know directly or contact our Practice Administrators, Manuelita Rubin or [employer], by asking at the front desk.                 Next Steps  [x]   Schedule Follow-Up:  We recommend a follow-up appointment in 1 year for your next wellness visit.  If you develop any new  problems, want to address any medical issues, or your condition worsens before then, please call us  for an appointment or seek emergency care. [x]   Preventive Care:  Make sure to keep regular appointments with dental and vision professionals, use nightly nasal saline mist sprays to keep your sinuses clear and toothbrushing to protect your teeth. Use SnoreLab App or other app to track your sleep quality. Check blood pressure and heart rate routinely. [x]   Medical Information Release:  For any relevant medical information we don't have, please sign a release form at the front desk so we can obtain it for your records. [x]   Lab Tests:  Schedule any lab tests from today for within a week to ensure best insurance coverage.    Making the Most of Our Focused (20 minute) Appointments:  [x]   Clearly state your top concerns at the beginning of the visit to focus our discussion [x]   If you anticipate you will need more time, please inform the front desk during scheduling - we can book multiple appointments in the same week. [x]   If you have transportation problems- use our convenient video appointments or ask about transportation support. [x]   We can get down to business faster if you use MyChart to update information before the visit and submit non-urgent questions before your visit. Thank you for taking the time to provide details through MyChart.  Let our nurse know and she can import this information into your encounter documents.  Arrival and Wait Times: [x]   Arriving on time ensures that everyone receives prompt attention. [x]   Early morning (8a) and afternoon (1p) appointments tend to have shortest wait times. [x]   Unfortunately, we cannot delay appointments for late arrivals or hold slots during phone calls.  Bring to Your Next Appointment  [x]   Medications: Please bring all your medication bottles to your next appointment to ensure we have an accurate record of your prescriptions. [x]   Health  Diaries: If you're monitoring any health conditions at home, keeping a diary of your readings can be very helpful for discussions at your next appointment.  Reviewing Your Records  [x]   Review your attached preventive care information at the end of these patient instructions. [x]   Review this early draft of your clinical encounter notes below and the final encounter summary tomorrow on MyChart after its been completed.   Encounter for annual general medical examination with abnormal findings in adult -     CBC with Differential/Platelet -     Comprehensive metabolic panel with GFR -     Lipid panel -     Hemoglobin A1c -     VITAMIN D  25 Hydroxy (Vit-D Deficiency, Fractures) -     B12 and Folate Panel  Encounter for annual health examination  Vitamin B12 deficiency Assessment & Plan: B12 levels are low, and insurance will cover testing due to previous abnormal results. Ordered B12 level test.  Orders: -     B12 and Folate Panel  Other hyperlipidemia Assessment & Plan: Cholesterol levels are not  optimal, but CT scan shows no heart damage. Risk of heart disease is low at 1-2%. Ordered cholesterol panel. Consider cholesterol medication if A1c increases or cholesterol worsens.  Orders: -     Lipid panel  Vitamin D  deficiency Assessment & Plan: Vitamin D  levels need monitoring as part of routine health maintenance. Ordered vitamin D  level test.  Orders: -     VITAMIN D  25 Hydroxy (Vit-D Deficiency, Fractures)  Vegetarian diet -     Ferritin  Prediabetes -     Hemoglobin A1c  Immunization due -     Flu vaccine trivalent PF, 6mos and older(Flulaval,Afluria,Fluarix,Fluzone)  Other orders -     HPV 9-valent vaccine,Recombinat     Getting Answers and Following Up  [x]   Simple Questions & Concerns: For quick questions or basic follow-up after your visit, reach us  at [phone] or MyChart messaging. [x]   Complex Concerns: If your concern is more complex, scheduling an  appointment might be best. Discuss this with the staff to find the most suitable option. [x]   Lab & Imaging Results: We'll contact you directly if results are abnormal or you don't use MyChart. Most normal results will be on MyChart within 2-3 business days, with a review message from Dr. Jesus. Haven't heard back in 2 weeks? Need results sooner? Contact us  at [phone]. [x]   Referrals: Our referral coordinator will manage specialist referrals. The specialist's office should contact you within 2 weeks to schedule an appointment. Call us  if you haven't heard from them after 2 weeks.  Staying Connected  [x]   MyChart: Activate your MyChart for the fastest way to access results and message us . See the last page of this paperwork for instructions on how to activate.  Billing  [x]   X-ray & Lab Orders: These are billed by separate companies. Contact the invoicing company directly for questions or concerns. [x]   Visit Charges: Discuss any billing inquiries with our administrative services team.  Your Satisfaction Matters  [x]   Share Your Experience: We strive for your satisfaction! If you have any complaints, or preferably compliments, please let Dr. Jesus know directly or contact our Practice Administrators, Manuelita Rubin or [employer], by asking at the front desk.    Medical Screening Exam A medical screening exam (MSE) helps to determine whether you need immediate medical treatment relating to any number of symptoms you are having. This type of exam may be done in an emergency department, an urgent care setting, or your health care provider's office. Depending on your symptoms and severity, you may need additional tests or medical therapy. It is important to note that an MSE does not necessarily mean that you will need or receive further medical testing or interventions if your symptoms are not deemed to be medically urgent (emergent). Tell a health care provider about: Any allergies  you have. All medicines you are taking, including vitamins, herbs, eye drops, creams, and over-the-counter medicines. Any problems you or family members have had with anesthetic medicines. Any bleeding problems you have. Any surgeries you have had. Any medical conditions you have. Whether you are pregnant or may be pregnant. What happens during the test? During the exam, a health care provider does a short, often focused, physical exam and asks about your medical history to assess: Your current symptoms. Your overall health. Your need for possible further medical intervention. What can I expect after the test? If you have a regular health care provider, make an appointment for a follow-up visit with him  or her. If you do not have a regular health care provider, ask about resources in your community. Your medical screening exam may determine that: You do not need emergency treatment at this time. You need treatment right away. You need to be transferred to another medical center. This may happen if you need an emergent specialist or consultant that is not available at the medical center you are at. You need to have more tests. A medical specialist may be consulted if needed. Get help right away if: Your condition gets worse. You develop new or troubling symptoms before you see your health care provider. These symptoms may represent a serious problem that is an emergency. Do not wait to see if the symptoms will go away. Get medical help right away. Call your local emergency services (911 in the [place] Do not drive yourself to the hospital. Summary A medical screening exam helps to determine whether you need medical treatment right away. This type of exam may be done in an emergency department, an urgent care setting, or your health care provider's office. During the exam, a health care provider does a short physical exam and asks about your current symptoms and overall health. Depending on  the exam, more tests or therapies may be ordered. However, an MSE does not necessarily mean that you will have further medical testing if your symptoms are not deemed to be urgent. If you need further care that is not offered at your current medical center, you may need to be transferred to another facility. This information is not intended to replace advice given to you by your health care provider. Make sure you discuss any questions you have with your health care provider. Document Revised: 09/08/2020 Document Reviewed: 05/06/2020 Elsevier Patient Education  2024 Elsevier Inc.     TRX and Suspension Trainers Feature Why It Matters  Weight capacity If it can't reliably take your weight + dynamic movement, it's a safety risk. Some good ones support 300-350 lbs. (Http://www.castillo-fisher.org/)  Good anchors Door anchor + suspension anchor (for beams/trees) + possibly wrap-around straps extend where you can use it. (Http://www.castillo-fisher.org/)  Durable straps & hardware Strong webbing, solid buckles/carabiners, metal adjusters where possible. Cheaper units tend to have weaker plastic parts. (Total Shape)  Handle comfort & foot cradles Handles should allow grip; foot cradles matter if you'll do lower-body or more dynamic moves. (loopsworld.com)  Easy adjustment & good strap markings Makes transitions smoother; helps ensure straps are of equal length. (Total Shape)  Warranty / support & extras A good brand usually gives a longer warranty or better customer service, and useful extras (travel bag, video guides, etc.) bring value. (Total Shape)   ?? Top Picks for Best Value Based on recent review roundups, here are some systems that are repeatedly identified as good value: System What Makes It Good Value / Pros Considerations  TRX Home2 System High quality build; includes door/suspension anchors; solid reputation. Often picked as best overall or best value among mid-to-upper-tier systems.  Price tends to be higher than generic  straps. Handles are foam (comfortable but less long-lasting than rubber/textured).   [employer] allows for wide/narrow/neutral angles; good versatility; fairly comfortable handles.  Some of the parts (foot cradles or smaller anchor hardware) may be less premium. Strap-length adjusters etc. not always as high end.  NOSSK Twin PRO / simpler brands Often cheaper; good for beginners; many users find NOSSK gives nearly TRX-level functionality for less money.  May cut cost in  comfort, grip, or anchoring options; may have less durable materials; fewer extras.  GoFit Gravity Straps Highly adjustable, decent capacity; good for outdoor / travel.  Sometimes lacking in strap length markings or premium hardware; anchors might be more basic.   ?? What Best Value Looks Like by Price Tier To help you pick, here's roughly what to expect at different price levels ([place]  market): Price Range What You Get Good Value Examples  $30-$60 Basic straps, maybe one door anchor, simpler handles, lighter duty hardware. Decent for casual / travel use. Some NOSSK or generic brands; often what reviewers call budget options.  $60-$120 Much better build, more anchoring options, better handles/cradles, extras (video guides, travel bag, etc.). TRX Home2, [employer]e, good mid-tier brands.  $120+ [employer], lots of accessories, long warranties, often used in gyms or by serious users. TRX Pro or similar.

## 2024-01-31 ENCOUNTER — Ambulatory Visit

## 2024-03-04 ENCOUNTER — Ambulatory Visit

## 2024-03-31 ENCOUNTER — Ambulatory Visit: Admitting: Internal Medicine

## 2024-07-01 ENCOUNTER — Ambulatory Visit

## 2024-12-31 ENCOUNTER — Encounter: Admitting: Internal Medicine
